# Patient Record
Sex: Female | Born: 1960 | Race: White | Hispanic: No | Marital: Married | Smoking: Former smoker
Health system: Southern US, Community
[De-identification: ages and names within clinical notes are randomized; demographics above are authoritative.]

## PROBLEM LIST (undated history)

## (undated) DIAGNOSIS — Z13220 Encounter for screening for lipoid disorders: Secondary | ICD-10-CM

---
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
## 2013-08-19 DIAGNOSIS — E785 Hyperlipidemia, unspecified: Secondary | ICD-10-CM | POA: Insufficient documentation

## 2013-08-19 DIAGNOSIS — I10 Essential (primary) hypertension: Secondary | ICD-10-CM | POA: Insufficient documentation

## 2013-08-19 DIAGNOSIS — E031 Congenital hypothyroidism without goiter: Secondary | ICD-10-CM | POA: Insufficient documentation

## 2014-01-25 DIAGNOSIS — L309 Dermatitis, unspecified: Secondary | ICD-10-CM | POA: Insufficient documentation

## 2014-06-10 DIAGNOSIS — J3089 Other allergic rhinitis: Secondary | ICD-10-CM | POA: Insufficient documentation

## 2015-07-20 DIAGNOSIS — R030 Elevated blood-pressure reading, without diagnosis of hypertension: Secondary | ICD-10-CM | POA: Diagnosis not present

## 2015-07-20 DIAGNOSIS — H6501 Acute serous otitis media, right ear: Secondary | ICD-10-CM | POA: Diagnosis not present

## 2015-07-20 DIAGNOSIS — Z7689 Persons encountering health services in other specified circumstances: Secondary | ICD-10-CM | POA: Diagnosis not present

## 2015-07-20 DIAGNOSIS — J069 Acute upper respiratory infection, unspecified: Secondary | ICD-10-CM | POA: Diagnosis not present

## 2015-09-26 DIAGNOSIS — Z1231 Encounter for screening mammogram for malignant neoplasm of breast: Secondary | ICD-10-CM | POA: Diagnosis not present

## 2015-09-26 DIAGNOSIS — Z1159 Encounter for screening for other viral diseases: Secondary | ICD-10-CM | POA: Diagnosis not present

## 2015-09-26 DIAGNOSIS — F419 Anxiety disorder, unspecified: Secondary | ICD-10-CM | POA: Diagnosis not present

## 2015-09-26 DIAGNOSIS — I10 Essential (primary) hypertension: Secondary | ICD-10-CM | POA: Diagnosis not present

## 2015-11-04 DIAGNOSIS — F3341 Major depressive disorder, recurrent, in partial remission: Secondary | ICD-10-CM | POA: Diagnosis not present

## 2016-04-30 DIAGNOSIS — F3341 Major depressive disorder, recurrent, in partial remission: Secondary | ICD-10-CM | POA: Diagnosis not present

## 2016-07-23 DIAGNOSIS — F3341 Major depressive disorder, recurrent, in partial remission: Secondary | ICD-10-CM | POA: Diagnosis not present

## 2016-08-06 DIAGNOSIS — L301 Dyshidrosis [pompholyx]: Secondary | ICD-10-CM | POA: Diagnosis not present

## 2016-08-06 DIAGNOSIS — I10 Essential (primary) hypertension: Secondary | ICD-10-CM | POA: Diagnosis not present

## 2016-09-03 DIAGNOSIS — F3341 Major depressive disorder, recurrent, in partial remission: Secondary | ICD-10-CM | POA: Diagnosis not present

## 2016-10-22 DIAGNOSIS — L309 Dermatitis, unspecified: Secondary | ICD-10-CM | POA: Diagnosis not present

## 2016-10-22 DIAGNOSIS — E031 Congenital hypothyroidism without goiter: Secondary | ICD-10-CM | POA: Diagnosis not present

## 2016-10-22 DIAGNOSIS — I10 Essential (primary) hypertension: Secondary | ICD-10-CM | POA: Diagnosis not present

## 2016-10-22 DIAGNOSIS — F339 Major depressive disorder, recurrent, unspecified: Secondary | ICD-10-CM | POA: Diagnosis not present

## 2016-11-01 DIAGNOSIS — F3341 Major depressive disorder, recurrent, in partial remission: Secondary | ICD-10-CM | POA: Diagnosis not present

## 2017-02-15 DIAGNOSIS — H2513 Age-related nuclear cataract, bilateral: Secondary | ICD-10-CM | POA: Diagnosis not present

## 2017-02-15 DIAGNOSIS — H524 Presbyopia: Secondary | ICD-10-CM | POA: Diagnosis not present

## 2017-02-15 DIAGNOSIS — H25043 Posterior subcapsular polar age-related cataract, bilateral: Secondary | ICD-10-CM | POA: Diagnosis not present

## 2017-02-15 DIAGNOSIS — D3131 Benign neoplasm of right choroid: Secondary | ICD-10-CM | POA: Diagnosis not present

## 2017-02-28 ENCOUNTER — Telehealth: Payer: Self-pay

## 2017-02-28 NOTE — Telephone Encounter (Signed)
Call was placed to patient to see who was managing her depression medications. [facility] and is aware that she will need to continue to go there to get her medications. Patient verbalizes understanding and states she loves them and will continue to see them for refills. Pcp is aware of this as well

## 2017-04-18 ENCOUNTER — Ambulatory Visit (INDEPENDENT_AMBULATORY_CARE_PROVIDER_SITE_OTHER): Payer: BLUE CROSS/BLUE SHIELD | Admitting: Physician Assistant

## 2017-04-18 ENCOUNTER — Encounter: Payer: Self-pay | Admitting: Physician Assistant

## 2017-04-18 VITALS — BP 130/74 | HR 76 | Temp 98.1°F | Resp 14 | Ht 65.5 in | Wt 190.4 lb

## 2017-04-18 DIAGNOSIS — I10 Essential (primary) hypertension: Secondary | ICD-10-CM

## 2017-04-18 DIAGNOSIS — Z0000 Encounter for general adult medical examination without abnormal findings: Secondary | ICD-10-CM | POA: Diagnosis not present

## 2017-04-18 LAB — BASIC METABOLIC PANEL WITH GFR
BUN: 15 mg/dL (ref 7–25)
CO2: 29 mmol/L (ref 20–32)
CREATININE: 0.75 mg/dL (ref 0.50–1.05)
Calcium: 9.8 mg/dL (ref 8.6–10.4)
Chloride: 100 mmol/L (ref 98–110)
GFR, Est African American: 101 mL/min/{1.73_m2} (ref >=60)
GFR, Est Non African American: 87 mL/min/{1.73_m2} (ref >=60)
Glucose, Bld: 95 mg/dL (ref 65–99)
Potassium: 4.1 mmol/L (ref 3.5–5.3)
Sodium: 138 mmol/L (ref 135–146)

## 2017-04-18 MED ORDER — LISINOPRIL-HYDROCHLOROTHIAZIDE 20-25 MG PO TABS: 1.0000 | ORAL_TABLET | Freq: Every day | ORAL | 1 refills | Status: DC

## 2017-04-18 NOTE — Progress Notes (Signed)
Patient ID: Elizabeth Cobb MRN: [id number], DOB: 12-29-1960, 57 y.o. Date of Encounter: @DATE @  Chief Complaint:  Chief Complaint  Patient presents with  . New Patient (Initial Visit)    HPI: 56 y.o. year old female  presents as a new patient to establish care.  She reports that she went to the [facility] for about 8 years.   However when that closed she then went to [facility] for 2 visits.   States that that is part of [facility] and she later found out that was not in her network for insurance.   Has some coworkers who come to our office who had recommended our office-- and-- also this location is close for her. States that her last visit at [facility] must have been about 11 months ago because they gave her a year worth of refills and she has about 1 month remaining on that.   Has continued to take her blood pressure medication daily and her medications have not run out.  She thinks that they did check her cholesterol at that last visit --(so they may have coded that as a CPE.)  Also goes to [facility].   States that she sees Melony Overly there.  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  Says that she can write prescriptions.   Asked if they had given her a diagnosis.  Says that she had been felt to have depression but now recently they have been wondering whether she may have been mis-diagnosed and may really be bipolar.   Says that she started going there in 2004-06-30.  States that "in 06/30/2004, after her father died, she could not quit crying ".   They prescribe her Prozac.  They recently added the Abilify and they are adjusting that.  They also prescribe the diazepam.  Says that she uses that as needed because sometimes stress and anxiety cause muscle tension especially in her neck and shoulder region.  In the past has used muscle relaxer and other medication for that muscle tension but they did not work well for that.  States that the Valium does  work better for those symptoms.  Reports that she has gone to [facility] 3 times in the last 2 months because of issues with her eyes and vision.  Reports that these are all of the specialists that she has seen and that this is all of the medical information that she is aware of.  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  Is able to do a lot of walking outside at the park which she says is very good for her.  States that she does not do well having to deal with people and that she likes to be alone.  She has no other concerns to address today. She has no other known past medical history to make me aware of.     History reviewed. No pertinent past medical history.   Home Meds: Outpatient Medications Prior to Visit  Medication Sig Dispense Refill  . b complex vitamins tablet Take 1 tablet by mouth daily.    . Cholecalciferol (VITAMIN D3) 2000 units TABS Take by mouth.    Marland Kitchen DIAZEPAM PO Take 10 mg by mouth as needed.     Marland Kitchen FLUoxetine (PROZAC) 20 MG tablet Take 20 mg by mouth daily. Patient taking 60 mg daily    . mometasone (ELOCON) 0.1 % cream Apply 1 application topically daily. Use  on hands and behind ears    . Omega-3 Fatty Acids (OMEGA 3 PO) Take by mouth.    . pyridOXINE (VITAMIN B-6) 100 MG tablet Take 100 mg by mouth daily.    Marland Kitchen lisinopril-hydrochlorothiazide (PRINZIDE,ZESTORETIC) 20-25 MG tablet Take 1 tablet by mouth daily.    Marland Kitchen pyridOXINE (VITAMIN B-6) 100 MG tablet Take 100 mg by mouth daily.    . ARIPiprazole (ABILIFY) 2 MG tablet Take 2 mg by mouth daily.     No facility-administered medications prior to visit.     Allergies: No Known Allergies  Social History   Socioeconomic History  . Marital status: Married    Spouse name: Not on file  . Number of children: Not on file  . Years of education: Not on file  . Highest education level: Not on file  Social Needs  . Financial resource strain: Not on file  . Food insecurity - worry:  Not on file  . Food insecurity - inability: Not on file  . Transportation needs - medical: Not on file  . Transportation needs - non-medical: Not on file  Occupational History  . Not on file  Tobacco Use  . Smoking status: Current Every Day Smoker    Types: E-cigarettes  . Smokeless tobacco: Never Used  Substance and Sexual Activity  . Alcohol use: Not on file  . Drug use: Not on file  . Sexual activity: Not on file  Other Topics Concern  . Not on file  Social History Narrative  . Not on file    History reviewed. No pertinent family history.   Review of Systems:  See HPI for pertinent ROS. All other ROS negative.    Physical Exam: Blood pressure 130/74, pulse 76, temperature 98.1 F (36.7 C), temperature source Oral, resp. rate 14, height 5' 5.5" (1.664 m), weight 86.4 kg (190 lb 6.4 oz), SpO2 96 %., Body mass index is 31.2 kg/m. General:  WNWD WF. Appears in no acute distress. Neck: Supple. No thyromegaly. No lymphadenopathy. Lungs: Clear bilaterally to auscultation without wheezes, rales, or rhonchi. Breathing is unlabored. Heart: RRR with S1 S2. No murmurs, rubs, or gallops. Abdomen: Soft, non-tender, non-distended with normoactive bowel sounds. No hepatomegaly. No rebound/guarding. No obvious abdominal masses. Musculoskeletal:  Strength and tone normal for age. Extremities/Skin: Warm and dry.  No edema.  Neuro: Alert and oriented X 3. Moves all extremities spontaneously. Gait is normal. CNII-XII grossly in tact. Psych:  Responds to questions appropriately with a normal affect.     ASSESSMENT AND PLAN:  57 y.o. year old female with   Encounter for medical examination to establish care  Essential hypertension Blood Pressure is at goal/controlled.  Continue current medication.  Check lab to monitor. - BASIC METABOLIC PANEL WITH GFR - lisinopril-hydrochlorothiazide (PRINZIDE,ZESTORETIC) 20-25 MG tablet; Take 1 tablet by mouth daily.  Dispense: 90 tablet; Refill:  1  Depression vs Bipolar Disorder --Managed by [facility] --They are in charge of prescribing Prozac, Abilify, Valium  I discussed scheduling a complete physical exam. It is possible that [facility] coded their last visit as a CPE and that visit was about 11 months ago. To be on the safe side,  will have her schedule next visit here in about 3 months as a CPE. She states she can come early morning and can come fasting. Discussed all of this with her and she is agreeable with this plan. Also discussed with schedulers/front office staff.  Follow up sooner if needed.  Signed,  [address] RoadMary Hartlyn StroudDixon, GeorgiaPA, [facility] 04/18/2017 11:04 AM

## 2017-05-13 DIAGNOSIS — F3341 Major depressive disorder, recurrent, in partial remission: Secondary | ICD-10-CM | POA: Diagnosis not present

## 2017-07-11 ENCOUNTER — Ambulatory Visit: Payer: BLUE CROSS/BLUE SHIELD | Admitting: Family Medicine

## 2017-07-11 ENCOUNTER — Encounter: Payer: Self-pay | Admitting: Family Medicine

## 2017-07-11 VITALS — BP 132/88 | HR 70 | Temp 98.1°F | Resp 14 | Ht 65.5 in | Wt 196.0 lb

## 2017-07-11 DIAGNOSIS — N3090 Cystitis, unspecified without hematuria: Secondary | ICD-10-CM | POA: Diagnosis not present

## 2017-07-11 DIAGNOSIS — R300 Dysuria: Secondary | ICD-10-CM | POA: Diagnosis not present

## 2017-07-11 LAB — URINALYSIS, ROUTINE W REFLEX MICROSCOPIC
BILIRUBIN URINE: NEGATIVE
GLUCOSE, UA: NEGATIVE
KETONES UR: NEGATIVE
NITRITE: NEGATIVE
PH: 6 (ref 5.0–8.0)
Protein, ur: NEGATIVE
Specific Gravity, Urine: 1.01 (ref 1.001–1.03)

## 2017-07-11 LAB — MICROSCOPIC MESSAGE

## 2017-07-11 MED ORDER — PHENAZOPYRIDINE HCL 200 MG PO TABS: 200.0000 mg | ORAL_TABLET | Freq: Three times a day (TID) | ORAL | 0 refills | Status: DC | PRN

## 2017-07-11 MED ORDER — CEPHALEXIN 500 MG PO CAPS: 500.0000 mg | ORAL_CAPSULE | Freq: Four times a day (QID) | ORAL | 0 refills | Status: AC

## 2017-07-11 NOTE — Progress Notes (Signed)
Patient ID: Elizabeth Cobb Keithley, female    DOB: 05/23/1960, 57 y.o.   MRN: [id number]  PCP: Dorena Bodoixon, Mary B, PA-C  Chief Complaint  Patient presents with  . Dysuria    symptoms for 8 days     Subjective:   Elizabeth Cobb Rhyne is a 57 y.o. female, presents to clinic with CC of 8 days of dyruria burning more painful at the end of urine stream, ongoing for 10 days.  Azo did nothing to help pain.  Pt has associated urinary frequency, hematuria, and decreased urine output.  She has bladder pressure, but she denies abdominal pain, flank pain, N, V, fever, chills, sweats, change in appetite, change in bowels When her symptoms started she had very mild external genital irritation and itching, she tried Monistat 7 day - last dose 2 days ago - a little soothing to external genitalia but did not change her urinary symptoms at all.  Denies any current vaginal discharge.    Patient Active Problem List   Diagnosis Date Noted  . Essential hypertension 04/18/2017     Prior to Admission medications   Medication Sig Start Date End Date Taking? Authorizing Provider  b complex vitamins tablet Take 1 tablet by mouth daily.   Yes [provider]  Cholecalciferol (VITAMIN D3) 2000 units TABS Take by mouth.   Yes [provider]  DIAZEPAM PO Take 10 mg by mouth as needed.    Yes [provider]  FLUoxetine (PROZAC) 20 MG tablet Take 20 mg by mouth daily. Patient taking 40 mg daily   Yes [provider]  lisinopril-hydrochlorothiazide (PRINZIDE,ZESTORETIC) 20-25 MG tablet Take 1 tablet by mouth daily. 04/18/17  Yes Dixon, Mary B, PA-C  mometasone (ELOCON) 0.1 % cream Apply 1 application topically daily. Use on hands and behind ears   Yes [provider]  Omega-3 Fatty Acids (OMEGA 3 PO) Take by mouth.   Yes [provider]  pyridOXINE (VITAMIN B-6) 100 MG tablet Take 100 mg by mouth daily.   Yes [provider]     No Known Allergies   No family history  on file.   Social History   Socioeconomic History  . Marital status: Married    Spouse name: Not on file  . Number of children: Not on file  . Years of education: Not on file  . Highest education level: Not on file  Occupational History  . Not on file  Social Needs  . Financial resource strain: Not on file  . Food insecurity:    Worry: Not on file    Inability: Not on file  . Transportation needs:    Medical: Not on file    Non-medical: Not on file  Tobacco Use  . Smoking status: Current Every Day Smoker    Types: E-cigarettes  . Smokeless tobacco: Never Used  Substance and Sexual Activity  . Alcohol use: Not on file  . Drug use: Not on file  . Sexual activity: Not on file  Lifestyle  . Physical activity:    Days per week: Not on file    Minutes per session: Not on file  . Stress: Not on file  Relationships  . Social connections:    Talks on phone: Not on file    Gets together: Not on file    Attends religious service: Not on file    Active member of club or organization: Not on file    Attends meetings of clubs or organizations: Not on file  Relationship status: Not on file  . Intimate partner violence:    Fear of current or ex partner: Not on file    Emotionally abused: Not on file    Physically abused: Not on file    Forced sexual activity: Not on file  Other Topics Concern  . Not on file  Social History Narrative  . Not on file     Review of Systems  Constitutional: Negative.  Negative for activity change, appetite change, chills, diaphoresis, fatigue, fever and unexpected weight change.  HENT: Negative.   Eyes: Negative.   Respiratory: Negative.   Cardiovascular: Negative.   Gastrointestinal: Negative.  Negative for abdominal pain, diarrhea, nausea and vomiting.  Endocrine: Negative.   Genitourinary: Positive for decreased urine volume, dysuria, frequency, hematuria, pelvic pain and urgency. Negative for difficulty urinating, dyspareunia, enuresis,  flank pain, genital sores, menstrual problem, vaginal bleeding, vaginal discharge and vaginal pain.  Musculoskeletal: Negative.  Negative for back pain.  Skin: Negative.   Allergic/Immunologic: Negative.   Neurological: Negative for dizziness, weakness and light-headedness.  All other systems reviewed and are negative.      Objective:    Vitals:   07/11/17 0808  BP: 132/88  Pulse: 70  Resp: 14  Temp: 98.1 F (36.7 C)  TempSrc: Oral  SpO2: 97%  Weight: 196 lb (88.9 kg)  Height: 5' 5.5" (1.664 m)      Physical Exam  Constitutional: She appears well-developed and well-nourished. No distress.  HENT:  Head: Normocephalic and atraumatic.  Nose: Nose normal.  Mouth/Throat: Oropharynx is clear and moist.  Eyes: Pupils are equal, round, and reactive to light. Conjunctivae are normal. Right eye exhibits no discharge. Left eye exhibits no discharge.  Neck: Normal range of motion. No tracheal deviation present.  Cardiovascular: Normal rate, regular rhythm, normal heart sounds and intact distal pulses. Exam reveals no gallop and no friction rub.  No murmur heard. Pulmonary/Chest: Effort normal and breath sounds normal. No stridor. No respiratory distress. She has no wheezes. She has no rales. She exhibits no tenderness.  Abdominal: Soft. Bowel sounds are normal. She exhibits no distension and no mass. There is no tenderness. There is no rebound and no guarding. No hernia.  No CVA tenderness b/l  Musculoskeletal: Normal range of motion.  Neurological: She is alert. She exhibits normal muscle tone. Coordination normal.  Skin: Skin is warm and dry. Capillary refill takes less than 2 seconds. No rash noted. She is not diaphoretic. No erythema. No pallor.  Psychiatric: She has a normal mood and affect. Her behavior is normal.  Nursing note and vitals reviewed.         Assessment & Plan:      ICD-10-CM   1. Dysuria R30.0 Urinalysis, Routine w reflex microscopic    UA pertinent for  +leuks and + blood, will culture, and start tx with keflex and pyridium.   Pt did not want to do any vaginal testing even though she endorsed some external genital irritation that got better with use of monistat.  I offered diflucan as an option to tx any worsening vaginitis sx.  She will do probiotics and yogurt with yeast cultures first and only fill diflucan as needed.   If sx do not resolve we have discussed other possible causes such as atrophic vaginitis, which would need further exam/evaluation, pt verbalized understanding of plan.   Danelle Berry, PA-C 07/11/17 8:22 AM

## 2017-07-11 NOTE — Patient Instructions (Addendum)
Will do keflex for 3-5 days, follow up as needed

## 2017-07-13 LAB — URINE CULTURE
MICRO NUMBER:: 90681600
SPECIMEN QUALITY: ADEQUATE

## 2017-07-14 NOTE — Progress Notes (Signed)
please notify pt of Urine culture + with bacteria E. Cool, it's very common.  She should finish antibiotics, drink plenty of fluids.  Follow up if not feeling better.

## 2017-07-22 ENCOUNTER — Encounter: Payer: BLUE CROSS/BLUE SHIELD | Admitting: Physician Assistant

## 2017-10-25 DIAGNOSIS — F3341 Major depressive disorder, recurrent, in partial remission: Secondary | ICD-10-CM | POA: Diagnosis not present

## 2017-10-28 ENCOUNTER — Ambulatory Visit (INDEPENDENT_AMBULATORY_CARE_PROVIDER_SITE_OTHER): Payer: BLUE CROSS/BLUE SHIELD | Admitting: Physician Assistant

## 2017-10-28 ENCOUNTER — Other Ambulatory Visit: Payer: Self-pay

## 2017-10-28 ENCOUNTER — Encounter: Payer: Self-pay | Admitting: Physician Assistant

## 2017-10-28 VITALS — BP 136/90 | HR 58 | Temp 97.6°F | Resp 18 | Ht 66.0 in | Wt 190.0 lb

## 2017-10-28 DIAGNOSIS — E0590 Thyrotoxicosis, unspecified without thyrotoxic crisis or storm: Secondary | ICD-10-CM | POA: Diagnosis not present

## 2017-10-28 DIAGNOSIS — I10 Essential (primary) hypertension: Secondary | ICD-10-CM

## 2017-10-28 DIAGNOSIS — Z23 Encounter for immunization: Secondary | ICD-10-CM | POA: Diagnosis not present

## 2017-10-28 DIAGNOSIS — Z0000 Encounter for general adult medical examination without abnormal findings: Secondary | ICD-10-CM

## 2017-10-28 MED ORDER — LISINOPRIL-HYDROCHLOROTHIAZIDE 20-25 MG PO TABS: 1.0000 | ORAL_TABLET | Freq: Every day | ORAL | 1 refills | Status: DC

## 2017-10-28 NOTE — Progress Notes (Signed)
Patient ID: Notnamed Croucher MRN: [id number], DOB: 11-20-1960, 57 y.o. Date of Encounter: 10/28/2017,   Chief Complaint: Physical (CPE)  HPI: 57 y.o. y/o female  here for CPE.    Today I have reviewed her office note from 04/18/2017. At that visit she presented as a new patient to establish care. That note includes information regarding her past medical history.  See that note for detail.  Today she presents for complete physical exam. However she states that she does not want to do a Pap smear or a breast exam or a pelvic exam.  Says that she "is not comfortable having people touch her"---and adds comment "well , we will not get into that today."    Review of Systems: Consitutional: No fever, chills, fatigue, night sweats, lymphadenopathy. No significant/unexplained weight changes. Eyes: No visual changes, eye redness, or discharge. ENT/Mouth: No ear pain, sore throat, nasal drainage, or sinus pain. Cardiovascular: No chest pressure,heaviness, tightness or squeezing, even with exertion. No increased shortness of breath or dyspnea on exertion.No palpitations, edema, orthopnea, PND. Respiratory: No cough, hemoptysis, SOB, or wheezing. Gastrointestinal: No anorexia, dysphagia, reflux, pain, nausea, vomiting, hematemesis, diarrhea, constipation, BRBPR, or melena. Breast: No mass, nodules, bulging, or retraction. No skin changes or inflammation. No nipple discharge. No lymphadenopathy. Genitourinary: No dysuria, hematuria, incontinence, vaginal discharge, pruritis, burning, abnormal bleeding, or pain. Musculoskeletal: No decreased ROM, No joint pain or swelling. No significant pain in neck, back, or extremities. Skin: No rash, pruritis, or concerning lesions. Neurological: No headache, dizziness, syncope, seizures, tremors, memory loss, coordination problems, or paresthesias. Psychological: No hallucinations, SI/HI. Endocrine: No polydipsia, polyphagia, polyuria, or known diabetes.No  increased fatigue. No palpitations/rapid heart rate. No significant/unexplained weight change. All other systems were reviewed and are otherwise negative.  History reviewed. No pertinent past medical history.   History reviewed. No pertinent surgical history.  Home Meds:  Outpatient Medications Prior to Visit  Medication Sig Dispense Refill  . b complex vitamins tablet Take 1 tablet by mouth daily.    . Cholecalciferol (VITAMIN D3) 2000 units TABS Take by mouth.    Marland Kitchen DIAZEPAM PO Take 10 mg by mouth as needed.     Marland Kitchen FLUoxetine (PROZAC) 20 MG tablet Take 20 mg by mouth daily. Patient taking 40 mg daily    . lisinopril-hydrochlorothiazide (PRINZIDE,ZESTORETIC) 20-25 MG tablet Take 1 tablet by mouth daily. 90 tablet 1  . mometasone (ELOCON) 0.1 % cream Apply 1 application topically daily. Use on hands and behind ears    . Omega-3 Fatty Acids (OMEGA 3 PO) Take by mouth.    . pyridOXINE (VITAMIN B-6) 100 MG tablet Take 100 mg by mouth daily.    . phenazopyridine (PYRIDIUM) 200 MG tablet Take 1 tablet (200 mg total) by mouth 3 (three) times daily as needed for pain. 10 tablet 0   No facility-administered medications prior to visit.     Allergies: No Known Allergies  Social History   Socioeconomic History  . Marital status: Married    Spouse name: Not on file  . Number of children: Not on file  . Years of education: Not on file  . Highest education level: Not on file  Occupational History  . Not on file  Social Needs  . Financial resource strain: Not on file  . Food insecurity:    Worry: Not on file    Inability: Not on file  . Transportation needs:    Medical: Not on file    Non-medical: Not on file  Tobacco Use  . Smoking status: Current Every Day Smoker    Types: E-cigarettes  . Smokeless tobacco: Never Used  Substance and Sexual Activity  . Alcohol use: Not on file  . Drug use: Not on file  . Sexual activity: Not on file  Lifestyle  . Physical activity:    Days per  week: Not on file    Minutes per session: Not on file  . Stress: Not on file  Relationships  . Social connections:    Talks on phone: Not on file    Gets together: Not on file    Attends religious service: Not on file    Active member of club or organization: Not on file    Attends meetings of clubs or organizations: Not on file    Relationship status: Not on file  . Intimate partner violence:    Fear of current or ex partner: Not on file    Emotionally abused: Not on file    Physically abused: Not on file    Forced sexual activity: Not on file  Other Topics Concern  . Not on file  Social History Narrative  . Not on file    Family History  Problem Relation Age of Onset  . Cancer Mother        Some type of Leukemia  . Lung cancer Father   . Bipolar disorder Sister     Physical Exam: Blood pressure 136/90, pulse (!) 58, temperature 97.6 F (36.4 C), temperature source Oral, resp. rate 18, height 5\' 6"  (1.676 m), weight 86.2 kg, SpO2 97 %., Body mass index is 30.67 kg/m. General: WF. Appears in no acute distress. HEENT: Normocephalic, atraumatic. Conjunctiva pink, sclera non-icteric. Pupils 2 mm constricting to 1 mm, round, regular, and equally reactive to light and accomodation. EOMI. Internal auditory canal clear. TMs with good cone of light and without pathology. Nasal mucosa pink. Nares are without discharge. No sinus tenderness. Oral mucosa pink. Neck: Supple. Trachea midline. No thyromegaly. Full ROM. No lymphadenopathy.No Carotid Bruits. Lungs: Clear to auscultation bilaterally without wheezes, rales, or rhonchi. Breathing is of normal effort and unlabored. Cardiovascular: RRR with S1 S2. No murmurs, rubs, or gallops. Distal pulses 2+ symmetrically. No carotid or abdominal bruits. Breast: She refuses/ defers breast exam. Abdomen: Soft, non-tender, non-distended with normoactive bowel sounds. No hepatosplenomegaly or masses. No rebound/guarding. No CVA tenderness. No  hernias.  Genitourinary:  She refuses / defers pelvic exam. Musculoskeletal: Full range of motion and 5/5 strength throughout.  Skin: Warm and moist without erythema, ecchymosis, wounds, or rash. Neuro: A+Ox3. CN II-XII grossly intact. Moves all extremities spontaneously. Full sensation throughout. Normal gait.  Psych:  Responds to questions appropriately with a normal affect.   Assessment/Plan:  57 y.o. y/o female here for CPE   1. Encounter for preventive health examination  A. Screening Labs: She is fasting today.  She is agreeable to check screening labs. - CBC with Differential/Platelet - COMPLETE METABOLIC PANEL WITH GFR - Lipid panel - TSH  B. Pap: She refuses pelvic exam, Pap smear today.  Aware of risk versus benefit but still defers.  C. Screening Mammogram: She is agreeable to have a mammogram.  Prefers to go to [facility]. - MM DIGITAL SCREENING BILATERAL; Future  D. DEXA/BMD:  She does not require bone density scan until closer to age 58.  E. Colorectal Cancer Screening: She refuses colonoscopy but states that she will do the Cologuard if it is covered by her insurance.  She is  to contact her insurance and find out with her specific plan whether they cover the Cologuard and what her cost would be and then if she is agreeable with that cost then will proceed with Cologuard.  F. Immunizations:  Influenza: Recommended flu vaccine but she defers. Tetanus: She states last tetanus was > 10 hours ago.  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 Pneumococcal: She has no indication for pneumonia vaccine until age 57 Shingrix: Discussed shingles and the shingles vaccine today but she defers.    2. Essential hypertension Blood pressure is at goal.  Continue current medication.  Check lab to monitor. - COMPLETE METABOLIC PANEL WITH GFR   3. Depression vs Bipolar  D/O --- [facility]  See my note from 04/18/2017 for information regarding this.           [address]igned, Mumtaz Lovins Meekah Mockingbird ValleyDixon, GeorgiaPA, [facility] 10/28/2017 10:47 AM

## 2017-10-28 NOTE — Addendum Note (Signed)
Addended by: Phineas SemenJOHNSON, Vernon Maish A on: 10/28/2017 04:31 PM   Modules accepted: Orders

## 2017-10-29 ENCOUNTER — Encounter: Payer: Self-pay | Admitting: Physician Assistant

## 2017-10-29 LAB — LIPID PANEL
CHOLESTEROL: 177 mg/dL (ref <200)
HDL: 42 mg/dL — ABNORMAL LOW (ref >50)
LDL Cholesterol (Calc): 98 mg/dL (calc)
Non-HDL Cholesterol (Calc): 135 mg/dL (calc) — ABNORMAL HIGH (ref <130)
Total CHOL/HDL Ratio: 4.2 (calc) (ref <5.0)
Triglycerides: 242 mg/dL — ABNORMAL HIGH (ref <150)

## 2017-10-29 LAB — COMPLETE METABOLIC PANEL WITH GFR
AG RATIO: 2 (calc) (ref 1.0–2.5)
ALBUMIN MSPROF: 4.5 g/dL (ref 3.6–5.1)
ALKALINE PHOSPHATASE (APISO): 56 U/L (ref 33–130)
ALT: 18 U/L (ref 6–29)
AST: 24 U/L (ref 10–35)
BILIRUBIN TOTAL: 0.3 mg/dL (ref 0.2–1.2)
BUN: 11 mg/dL (ref 7–25)
CHLORIDE: 99 mmol/L (ref 98–110)
CO2: 30 mmol/L (ref 20–32)
Calcium: 9.8 mg/dL (ref 8.6–10.4)
Creat: 0.69 mg/dL (ref 0.50–1.05)
GFR, EST AFRICAN AMERICAN: 110 mL/min/{1.73_m2} (ref >=60)
GFR, Est Non African American: 95 mL/min/{1.73_m2} (ref >=60)
GLOBULIN: 2.2 g/dL (ref 1.9–3.7)
Glucose, Bld: 92 mg/dL (ref 65–99)
Potassium: 4 mmol/L (ref 3.5–5.3)
Sodium: 138 mmol/L (ref 135–146)
TOTAL PROTEIN: 6.7 g/dL (ref 6.1–8.1)

## 2017-10-29 LAB — T4, FREE: Free T4: 0.9 ng/dL (ref 0.8–1.8)

## 2017-10-29 LAB — CBC WITH DIFFERENTIAL/PLATELET
BASOS ABS: 38 {cells}/uL (ref 0–200)
BASOS PCT: 0.8 %
EOS ABS: 139 {cells}/uL (ref 15–500)
Eosinophils Relative: 2.9 %
HEMATOCRIT: 37.7 % (ref 35.0–45.0)
HEMOGLOBIN: 12.8 g/dL (ref 11.7–15.5)
Lymphs Abs: 1498 cells/uL (ref 850–3900)
MCH: 30.5 pg (ref 27.0–33.0)
MCHC: 34 g/dL (ref 32.0–36.0)
MCV: 90 fL (ref 80.0–100.0)
MONOS PCT: 10.9 %
MPV: 13.5 fL — ABNORMAL HIGH (ref 7.5–12.5)
NEUTROS ABS: 2602 {cells}/uL (ref 1500–7800)
Neutrophils Relative %: 54.2 %
Platelets: 131 10*3/uL — ABNORMAL LOW (ref 140–400)
RBC: 4.19 10*6/uL (ref 3.80–5.10)
RDW: 11.8 % (ref 11.0–15.0)
Total Lymphocyte: 31.2 %
WBC: 4.8 10*3/uL (ref 3.8–10.8)
WBCMIX: 523 {cells}/uL (ref 200–950)

## 2017-10-29 LAB — TEST AUTHORIZATION

## 2017-10-29 LAB — TSH: TSH: 6.99 m[IU]/L — AB (ref 0.40–4.50)

## 2017-11-05 DIAGNOSIS — E038 Other specified hypothyroidism: Secondary | ICD-10-CM | POA: Insufficient documentation

## 2017-11-05 DIAGNOSIS — E039 Hypothyroidism, unspecified: Secondary | ICD-10-CM | POA: Insufficient documentation

## 2017-11-21 ENCOUNTER — Other Ambulatory Visit: Payer: Self-pay | Admitting: Physician Assistant

## 2017-11-21 ENCOUNTER — Ambulatory Visit (HOSPITAL_COMMUNITY)
Admission: RE | Admit: 2017-11-21 | Discharge: 2017-11-21 | Disposition: A | Discharge status: Home / Self Care | Payer: BLUE CROSS/BLUE SHIELD | Source: Ambulatory Visit | Attending: Physician Assistant | Admitting: Physician Assistant

## 2017-11-21 DIAGNOSIS — Z0000 Encounter for general adult medical examination without abnormal findings: Secondary | ICD-10-CM | POA: Diagnosis not present

## 2017-11-21 DIAGNOSIS — Z1231 Encounter for screening mammogram for malignant neoplasm of breast: Secondary | ICD-10-CM | POA: Diagnosis not present

## 2018-02-21 ENCOUNTER — Encounter: Payer: Self-pay | Admitting: Emergency Medicine

## 2018-02-21 DIAGNOSIS — F411 Generalized anxiety disorder: Secondary | ICD-10-CM | POA: Insufficient documentation

## 2018-02-21 DIAGNOSIS — F329 Major depressive disorder, single episode, unspecified: Secondary | ICD-10-CM | POA: Insufficient documentation

## 2018-04-11 ENCOUNTER — Ambulatory Visit (INDEPENDENT_AMBULATORY_CARE_PROVIDER_SITE_OTHER): Payer: BLUE CROSS/BLUE SHIELD | Admitting: Physician Assistant

## 2018-04-11 ENCOUNTER — Encounter: Payer: Self-pay | Admitting: Physician Assistant

## 2018-04-11 DIAGNOSIS — F411 Generalized anxiety disorder: Secondary | ICD-10-CM

## 2018-04-11 DIAGNOSIS — F331 Major depressive disorder, recurrent, moderate: Secondary | ICD-10-CM

## 2018-04-11 MED ORDER — FLUOXETINE HCL 20 MG PO TABS: 40.0000 mg | ORAL_TABLET | Freq: Every day | ORAL | 1 refills | Status: DC

## 2018-04-11 MED ORDER — DIAZEPAM 10 MG PO TABS: 10.0000 mg | ORAL_TABLET | Freq: Three times a day (TID) | ORAL | 5 refills | Status: DC | PRN

## 2018-04-11 NOTE — Progress Notes (Signed)
[facility]  Patient ID: Elizabeth Cobb,  MRN: [id number]  PCP: Danelle Berry, PA-C  Date of Evaluation: 04/11/2018 Time spent:15 minutes  Chief Complaint:  Chief Complaint    Follow-up      HISTORY/CURRENT STATUS: HPI Here for routine med check.  Doing really well.  She's had 2 panic attacks since LOV.  Took the Valium which helped.   Patient denies loss of interest in usual activities and is able to enjoy things.  Denies decreased energy or motivation.  Appetite has not changed.  No extreme sadness, tearfulness, or feelings of hopelessness.  Denies any changes in concentration, making decisions or remembering things.  Denies suicidal or homicidal thoughts.  Patient denies increased energy with decreased need for sleep, no increased talkativeness, no racing thoughts, no impulsivity or risky behaviors, no increased spending, no increased libido, no grandiosity.  Denies muscle or joint pain, stiffness, or dystonia.  Denies dizziness, syncope, seizures, numbness, tingling, tremor, tics, unsteady gait, slurred speech, confusion.   Individual Medical History/ Review of Systems: Changes? :No    Past medications for mental health diagnoses include: Chantix, Prozac, Xanax, Remeron, Klonopin, Wellbutrin caused increased agitation.   Allergies: Patient has no known allergies.  Current Medications:  Current Outpatient Medications:  .  b complex vitamins tablet, Take 1 tablet by mouth daily., Disp: , Rfl:  .  Cholecalciferol (VITAMIN D3) 2000 units TABS, Take by mouth., Disp: , Rfl:  .  FLUoxetine (PROZAC) 20 MG tablet, Take 2 tablets (40 mg total) by mouth daily., Disp: 180 tablet, Rfl: 1 .  lisinopril-hydrochlorothiazide (PRINZIDE,ZESTORETIC) 20-25 MG tablet, Take 1 tablet by mouth daily., Disp: 90 tablet, Rfl: 1 .  Omega-3 Fatty Acids (OMEGA 3 PO), Take by mouth., Disp: , Rfl:  .  pyridOXINE (VITAMIN B-6) 100 MG tablet, Take 100 mg by mouth daily., Disp: , Rfl:  .   diazepam (VALIUM) 10 MG tablet, Take 1 tablet (10 mg total) by mouth every 8 (eight) hours as needed., Disp: 90 tablet, Rfl: 5 .  mometasone (ELOCON) 0.1 % cream, Apply 1 application topically daily. Use on hands and behind ears, Disp: , Rfl:  Medication Side Effects: none  Family Medical/ Social History: Changes? No  MENTAL HEALTH EXAM:  There were no vitals taken for this visit.There is no height or weight on file to calculate BMI.  General Appearance: Casual and Well Groomed  Eye Contact:  Good  Speech:  Clear and Coherent  Volume:  Normal  Mood:  Euthymic  Affect:  Appropriate  Thought Process:  Goal Directed  Orientation:  Full (Time, Place, and Person)  Thought Content: Logical   Suicidal Thoughts:  No  Homicidal Thoughts:  No  Memory:  WNL  Judgement:  Good  Insight:  Good  Psychomotor Activity:  Normal  Concentration:  Concentration: Good  Recall:  Good  Fund of Knowledge: Good  Language: Good  Assets:  Desire for Improvement  ADL's:  Intact  Cognition: WNL  Prognosis:  Good    DIAGNOSES:    ICD-10-CM   1. Major depressive disorder, recurrent episode, moderate (HCC) F33.1   2. Generalized anxiety disorder F41.1     Receiving Psychotherapy: No    RECOMMENDATIONS: Continue Prozac 40 mg daily.  She prefers to have the 20 mg pills and take 2 of those because they are easier to swallow. Continue Valium 10 mg every 8 hours as needed. Continue vitamin B complex, omega-3 fatty acids, vitamin D, and a multivitamin every day. Return in 6 months  or sooner as needed.  Melony Overly, PA-C   This record has been created using [employer].  Chart creation errors have been sought, but may not always have been located and corrected. Such creation errors do not reflect on the standard of medical care.

## 2018-05-01 ENCOUNTER — Ambulatory Visit: Payer: BLUE CROSS/BLUE SHIELD | Admitting: Physician Assistant

## 2018-05-01 ENCOUNTER — Encounter: Payer: Self-pay | Admitting: Family Medicine

## 2018-05-01 ENCOUNTER — Ambulatory Visit (INDEPENDENT_AMBULATORY_CARE_PROVIDER_SITE_OTHER): Payer: BLUE CROSS/BLUE SHIELD | Admitting: Family Medicine

## 2018-05-01 ENCOUNTER — Other Ambulatory Visit: Payer: Self-pay

## 2018-05-01 VITALS — BP 142/88 | HR 78 | Temp 98.0°F | Resp 18 | Ht 66.0 in | Wt 194.4 lb

## 2018-05-01 DIAGNOSIS — E038 Other specified hypothyroidism: Secondary | ICD-10-CM

## 2018-05-01 DIAGNOSIS — E669 Obesity, unspecified: Secondary | ICD-10-CM

## 2018-05-01 DIAGNOSIS — Z13220 Encounter for screening for lipoid disorders: Secondary | ICD-10-CM | POA: Diagnosis not present

## 2018-05-01 DIAGNOSIS — E039 Hypothyroidism, unspecified: Secondary | ICD-10-CM

## 2018-05-01 DIAGNOSIS — L309 Dermatitis, unspecified: Secondary | ICD-10-CM

## 2018-05-01 DIAGNOSIS — I10 Essential (primary) hypertension: Secondary | ICD-10-CM

## 2018-05-01 DIAGNOSIS — F411 Generalized anxiety disorder: Secondary | ICD-10-CM

## 2018-05-01 DIAGNOSIS — Z6831 Body mass index (BMI) 31.0-31.9, adult: Secondary | ICD-10-CM

## 2018-05-01 DIAGNOSIS — J310 Chronic rhinitis: Secondary | ICD-10-CM

## 2018-05-01 MED ORDER — FLUTICASONE PROPIONATE 50 MCG/ACT NA SUSP: 2.0000 | Freq: Every day | NASAL | 11 refills | Status: DC

## 2018-05-01 MED ORDER — LISINOPRIL 20 MG PO TABS: 20.0000 mg | ORAL_TABLET | Freq: Every day | ORAL | 3 refills | Status: DC

## 2018-05-01 MED ORDER — MOMETASONE FUROATE 0.1 % EX CREA: 1.0000 "application " | TOPICAL_CREAM | Freq: Every day | CUTANEOUS | 2 refills | Status: DC

## 2018-05-01 MED ORDER — HYDROCHLOROTHIAZIDE 25 MG PO TABS: 25.0000 mg | ORAL_TABLET | Freq: Every day | ORAL | 3 refills | Status: DC

## 2018-05-01 NOTE — Patient Instructions (Addendum)
Get and take daily at bedtime zyrtec, claritin or allegra  You can continue flonase daily  As needed the over the counter decongestant    Additional Hand outs to sent to pt with lab results    Hypothyroidism  Hypothyroidism is when the thyroid gland does not make enough of certain hormones (it is underactive). The thyroid gland is a small gland located in the lower front part of the neck, just in front of the windpipe (trachea). This gland makes hormones that help control how the body uses food for energy (metabolism) as well as how the heart and brain function. These hormones also play a role in keeping your bones strong. When the thyroid is underactive, it produces too little of the hormones thyroxine (T4) and triiodothyronine (T3). What are the causes? This condition may be caused by:  Hashimoto's disease. This is a disease in which the body's disease-fighting system (immune system) attacks the thyroid gland. This is the most common cause.  Viral infections.  Pregnancy.  Certain medicines.  Birth defects.  Past radiation treatments to the head or neck for cancer.  Past treatment with radioactive iodine.  Past exposure to radiation in the environment.  Past surgical removal of part or all of the thyroid.  Problems with a gland in the center of the brain (pituitary gland).  Lack of enough iodine in the diet. What increases the risk? You are more likely to develop this condition if:  You are female.  You have a family history of thyroid conditions.  You use a medicine called lithium.  You take medicines that affect the immune system (immunosuppressants). What are the signs or symptoms? Symptoms of this condition include:  Feeling as though you have no energy (lethargy).  Not being able to tolerate cold.  Weight gain that is not explained by a change in diet or exercise habits.  Lack of appetite.  Dry skin.  Coarse hair.  Menstrual  irregularity.  Slowing of thought processes.  Constipation.  Sadness or depression. How is this diagnosed? This condition may be diagnosed based on:  Your symptoms, your medical history, and a physical exam.  Blood tests. You may also have imaging tests, such as an ultrasound or MRI. How is this treated? This condition is treated with medicine that replaces the thyroid hormones that your body does not make. After you begin treatment, it may take several weeks for symptoms to go away. Follow these instructions at home:  Take over-the-counter and prescription medicines only as told by your health care provider.  If you start taking any new medicines, tell your health care provider.  Keep all follow-up visits as told by your health care provider. This is important. ? As your condition improves, your dosage of thyroid hormone medicine may change. ? You will need to have blood tests regularly so that your health care provider can monitor your condition. Contact a health care provider if:  Your symptoms do not get better with treatment.  You are taking thyroid replacement medicine and you: ? Sweat a lot. ? Have tremors. ? Feel anxious. ? Lose weight rapidly. ? Cannot tolerate heat. ? Have emotional swings. ? Have diarrhea. ? Feel weak. Get help right away if you have:  Chest pain.  An irregular heartbeat.  A rapid heartbeat.  Difficulty breathing. Summary  Hypothyroidism is when the thyroid gland does not make enough of certain hormones (it is underactive).  When the thyroid is underactive, it produces too little of the hormones  thyroxine (T4) and triiodothyronine (T3).  The most common cause is Hashimoto's disease, a disease in which the body's disease-fighting system (immune system) attacks the thyroid gland. The condition can also be caused by viral infections, medicine, pregnancy, or past radiation treatment to the head or neck.  Symptoms may include weight gain,  dry skin, constipation, feeling as though you do not have energy, and not being able to tolerate cold.  This condition is treated with medicine to replace the thyroid hormones that your body does not make. This information is not intended to replace advice given to you by your health care provider. Make sure you discuss any questions you have with your health care provider. Document Released: 01/22/2005 Document Revised: 01/02/2017 Document Reviewed: 01/02/2017 Elsevier Interactive Patient Education  2019 [employer]   High Cholesterol  High cholesterol is a condition in which the blood has high levels of a white, waxy, fat-like substance (cholesterol). The human body needs small amounts of cholesterol. The liver makes all the cholesterol that the body needs. Extra (excess) cholesterol comes from the food that we eat. Cholesterol is carried from the liver by the blood through the blood vessels. If you have high cholesterol, deposits (plaques) may build up on the walls of your blood vessels (arteries). Plaques make the arteries narrower and stiffer. Cholesterol plaques increase your risk for heart attack and stroke. Work with your health care provider to keep your cholesterol levels in a healthy range. What increases the risk? This condition is more likely to develop in people who:  Eat foods that are high in animal fat (saturated fat) or cholesterol.  Are overweight.  Are not getting enough exercise.  Have a family history of high cholesterol. What are the signs or symptoms? There are no symptoms of this condition. How is this diagnosed? This condition may be diagnosed from the results of a blood test.  If you are older than age 75, your health care provider may check your cholesterol every 4-6 years.  You may be checked more often if you already have high cholesterol or other risk factors for heart disease. The blood test for cholesterol measures:  "Bad" cholesterol (LDL  cholesterol). This is the main type of cholesterol that causes heart disease. The desired level for LDL is less than 100.  "Good" cholesterol (HDL cholesterol). This type helps to protect against heart disease by cleaning the arteries and carrying the LDL away. The desired level for HDL is 60 or higher.  Triglycerides. These are fats that the body can store or burn for energy. The desired number for triglycerides is lower than 150.  Total cholesterol. This is a measure of the total amount of cholesterol in your blood, including LDL cholesterol, HDL cholesterol, and triglycerides. A healthy number is less than 200. How is this treated? This condition is treated with diet changes, lifestyle changes, and medicines. Diet changes  This may include eating more whole grains, fruits, vegetables, nuts, and fish.  This may also include cutting back on red meat and foods that have a lot of added sugar. Lifestyle changes  Changes may include getting at least 40 minutes of aerobic exercise 3 times a week. Aerobic exercises include walking, biking, and swimming. Aerobic exercise along with a healthy diet can help you maintain a healthy weight.  Changes may also include quitting smoking. Medicines  Medicines are usually given if diet and lifestyle changes have failed to reduce your cholesterol to healthy levels.  Your health care provider may  prescribe a statin medicine. Statin medicines have been shown to reduce cholesterol, which can reduce the risk of heart disease. Follow these instructions at home: Eating and drinking If told by your health care provider:  Eat chicken (without skin), fish, veal, shellfish, ground [place] breast, and round or loin cuts of red meat.  Do not eat fried foods or fatty meats, such as hot dogs and salami.  Eat plenty of fruits, such as apples.  Eat plenty of vegetables, such as broccoli, potatoes, and carrots.  Eat beans, peas, and lentils.  Eat grains such as  barley, rice, couscous, and bulgur wheat.  Eat pasta without cream sauces.  Use skim or nonfat milk, and eat low-fat or nonfat yogurt and cheeses.  Do not eat or drink whole milk, cream, ice cream, egg yolks, or hard cheeses.  Do not eat stick margarine or tub margarines that contain trans fats (also called partially hydrogenated oils).  Do not eat saturated tropical oils, such as coconut oil and palm oil.  Do not eat cakes, cookies, crackers, or other baked goods that contain trans fats.  General instructions  Exercise as directed by your health care provider. Increase your activity level with activities such as gardening, walking, and taking the stairs.  Take over-the-counter and prescription medicines only as told by your health care provider.  Do not use any products that contain nicotine or tobacco, such as cigarettes and e-cigarettes. If you need help quitting, ask your health care provider.  Keep all follow-up visits as told by your health care provider. This is important. Contact a health care provider if:  You are struggling to maintain a healthy diet or weight.  You need help to start on an exercise program.  You need help to stop smoking. Get help right away if:  You have chest pain.  You have trouble breathing. This information is not intended to replace advice given to you by your health care provider. Make sure you discuss any questions you have with your health care provider. Document Released: 01/22/2005 Document Revised: 08/20/2015 Document Reviewed: 07/23/2015 Elsevier Interactive Patient Education  [employer].

## 2018-05-01 NOTE — Progress Notes (Signed)
Patient ID: Elizabeth Cobb, female    DOB: 1960-06-15, 58 y.o.   MRN: [id number]  PCP: Danelle Berry, PA-C  Chief Complaint  Patient presents with  . Hypertension    Subjective:   Elizabeth Cobb is a 58 y.o. female, presents to clinic with CC of HTN follow up - usually does a  6 month recheck   She reports that she has gained weight, but her BP is good.  She has been taking lisinopril and hydrochlorothiazide in a new combination pill that Shon Hale gave her about 6 months ago, she is compliant with the medications but she does not like the bigger size of the pill.  Not having any blood pressure issues and denies any side effects.  Trouble with swallowing - sounds like, she mentioned that it is hard for her to swallow two different tablets of her routine meds.  She states that it just feels dry in her throat.  She is not having any dysphasia with solids liquids or with her pills she just is likely it fills.  She has no stridor, or sensation of blockage in her neck throat or chest.    Psychiatric -she manages all her psychiatric medicines with her PA Hurst, manages mood, anxiety, talks to the PA is doing well on Valium and Prozac, and she has what she needs (relative to COVID).  She would like to separate her BP meds again to make smaller.      Patient Active Problem List   Diagnosis Date Noted  . MDD (major depressive disorder) 02/21/2018  . GAD (generalized anxiety disorder) 02/21/2018  . Subclinical hypothyroidism 11/05/2017  . Essential hypertension 04/18/2017    Current Meds  Medication Sig  . b complex vitamins tablet Take 1 tablet by mouth daily.  . Cholecalciferol (VITAMIN D3) 2000 units TABS Take by mouth.  . diazepam (VALIUM) 10 MG tablet Take 1 tablet (10 mg total) by mouth every 8 (eight) hours as needed.  Marland Kitchen FLUoxetine (PROZAC) 20 MG tablet Take 2 tablets (40 mg total) by mouth daily.  . fluticasone (FLONASE) 50 MCG/ACT nasal spray Place into both nostrils daily.  Marland Kitchen  lisinopril-hydrochlorothiazide (PRINZIDE,ZESTORETIC) 20-25 MG tablet Take 1 tablet by mouth daily.  . mometasone (ELOCON) 0.1 % cream Apply 1 application topically daily. Use on hands and behind ears  . Omega-3 Fatty Acids (OMEGA 3 PO) Take by mouth.  . pyridOXINE (VITAMIN B-6) 100 MG tablet Take 100 mg by mouth daily.     Review of Systems  Constitutional: Negative.   HENT: Negative.   Eyes: Negative.   Respiratory: Negative.   Cardiovascular: Negative.   Gastrointestinal: Negative.   Endocrine: Negative.   Genitourinary: Negative.   Musculoskeletal: Negative.   Skin: Negative.   Allergic/Immunologic: Negative.   Neurological: Negative.   Hematological: Negative.   Psychiatric/Behavioral: Negative.   All other systems reviewed and are negative.      Objective:    Vitals:   05/01/18 1123  BP: (!) 142/88  Pulse: 78  Resp: 18  Temp: 98 F (36.7 C)  SpO2: 96%  Weight: 194 lb 6.4 oz (88.2 kg)  Height:  (1.676 m)      Physical Exam Vitals signs and nursing note reviewed.  Constitutional:      General: She is not in acute distress.    Appearance: Normal appearance. She is well-developed. She is obese. She is not ill-appearing, toxic-appearing or diaphoretic.  HENT:     Head: Normocephalic and atraumatic.  Right Ear: External ear normal.     Left Ear: External ear normal.     Nose: Nose normal.     Mouth/Throat:     Pharynx: Oropharynx is clear. Uvula midline. No oropharyngeal exudate or posterior oropharyngeal erythema.  Eyes:     General: Lids are normal.     Conjunctiva/sclera: Conjunctivae normal.     Pupils: Pupils are equal, round, and reactive to light.  Neck:     Musculoskeletal: Normal range of motion and neck supple.     Thyroid: No thyromegaly.     Trachea: Trachea and phonation normal. No tracheal deviation.  Cardiovascular:     Rate and Rhythm: Normal rate and regular rhythm.     Pulses: Normal pulses.          Radial pulses are 2+ on the  right side and 2+ on the left side.       Posterior tibial pulses are 2+ on the right side and 2+ on the left side.     Heart sounds: Normal heart sounds. No murmur. No friction rub. No gallop.   Pulmonary:     Effort: Pulmonary effort is normal. No respiratory distress.     Breath sounds: Normal breath sounds. No stridor. No wheezing, rhonchi or rales.  Chest:     Chest wall: No tenderness.  Abdominal:     General: Bowel sounds are normal. There is no distension.     Palpations: Abdomen is soft. There is no mass.     Tenderness: There is no abdominal tenderness. There is no right CVA tenderness, left CVA tenderness, guarding or rebound.     Hernia: No hernia is present.  Musculoskeletal: Normal range of motion.        General: No deformity.     Right lower leg: No edema.     Left lower leg: No edema.  Lymphadenopathy:     Cervical: No cervical adenopathy.  Skin:    General: Skin is warm and dry.     Capillary Refill: Capillary refill takes less than 2 seconds.     Coloration: Skin is not pale.     Findings: No rash.  Neurological:     Mental Status: She is alert and oriented to person, place, and time.     Motor: No abnormal muscle tone.     Gait: Gait normal.  Psychiatric:        Mood and Affect: Mood normal.        Speech: Speech normal.        Behavior: Behavior normal.        Thought Content: Thought content normal.           Assessment & Plan:   [name] here for 6 month f/up, transferring my my pt panel.     Problem List Items Addressed This Visit      Cardiovascular and Mediastinum   Essential hypertension - Primary    Compliant with meds - wants them separated into two pills BP mildly elevated here today, but she suffers from anxiety - suspect some white coat HTN, she monitors at home and it runs 120/70 usually Return to check labs CMP      Relevant Medications   lisinopril (PRINIVIL,ZESTRIL) 20 MG tablet   hydrochlorothiazide (HYDRODIURIL) 25 MG  tablet   Other Relevant Orders   COMPLETE METABOLIC PANEL WITH GFR     Endocrine   Subclinical hypothyroidism    Hx of with past elevated TSH - she never  wanted to treat it, was scared of taking synthroid. After discussion, she agreed to recheck labs and see. She's had some weight gain TSH and free T4      Relevant Orders   T4, free   TSH     Other   GAD (generalized anxiety disorder)    Well controlled - managed by Psych      Class 1 obesity with body mass index (BMI) of 31.0 to 31.9 in adult    Increased weight - check labs TSH, free T4, lipid panel Increase exercise and decrease caloric intake      Relevant Orders   T4, free   COMPLETE METABOLIC PANEL WITH GFR   CBC with Differential/Platelet   TSH   Lipid panel   Screening for hyperlipidemia   Relevant Orders   COMPLETE METABOLIC PANEL WITH GFR   Lipid panel    Other Visit Diagnoses    Eczema, unspecified type       Relevant Medications   mometasone (ELOCON) 0.1 % cream   Rhinitis, unspecified type       mild nasal sx, OTC meds, f/up as needed     Med refill for derm meds/creams     Danelle Berry, PA-C 05/01/18 11:29 AM

## 2018-05-02 ENCOUNTER — Other Ambulatory Visit: Payer: BLUE CROSS/BLUE SHIELD

## 2018-05-02 DIAGNOSIS — I10 Essential (primary) hypertension: Secondary | ICD-10-CM | POA: Diagnosis not present

## 2018-05-02 DIAGNOSIS — Z13220 Encounter for screening for lipoid disorders: Secondary | ICD-10-CM | POA: Insufficient documentation

## 2018-05-02 DIAGNOSIS — Z6831 Body mass index (BMI) 31.0-31.9, adult: Secondary | ICD-10-CM

## 2018-05-02 DIAGNOSIS — E039 Hypothyroidism, unspecified: Secondary | ICD-10-CM

## 2018-05-02 DIAGNOSIS — E669 Obesity, unspecified: Secondary | ICD-10-CM

## 2018-05-02 DIAGNOSIS — E038 Other specified hypothyroidism: Secondary | ICD-10-CM

## 2018-05-02 HISTORY — DX: Encounter for screening for lipoid disorders: Z13.220

## 2018-05-02 NOTE — Assessment & Plan Note (Signed)
Well controlled - managed by Psych 

## 2018-05-02 NOTE — Assessment & Plan Note (Signed)
Increased weight - check labs TSH, free T4, lipid panel Increase exercise and decrease caloric intake

## 2018-05-02 NOTE — Assessment & Plan Note (Signed)
Well controlled - managed by Psych

## 2018-05-02 NOTE — Assessment & Plan Note (Signed)
Compliant with meds - wants them separated into two pills BP mildly elevated here today, but she suffers from anxiety - suspect some white coat HTN, she monitors at home and it runs 120/70 usually Return to check labs CMP

## 2018-05-02 NOTE — Assessment & Plan Note (Signed)
Hx of with past elevated TSH - she never wanted to treat it, was scared of taking synthroid. After discussion, she agreed to recheck labs and see. She's had some weight gain TSH and free T4

## 2018-05-03 LAB — LIPID PANEL
Cholesterol: 218 mg/dL — ABNORMAL HIGH (ref <200)
HDL: 38 mg/dL — ABNORMAL LOW (ref >=50)
LDL Cholesterol (Calc): 137 mg/dL (calc) — ABNORMAL HIGH
Non-HDL Cholesterol (Calc): 180 mg/dL (calc) — ABNORMAL HIGH (ref <130)
Total CHOL/HDL Ratio: 5.7 (calc) — ABNORMAL HIGH (ref <5.0)
Triglycerides: 277 mg/dL — ABNORMAL HIGH (ref <150)

## 2018-05-03 LAB — COMPLETE METABOLIC PANEL WITH GFR
AG Ratio: 1.8 (calc) (ref 1.0–2.5)
ALKALINE PHOSPHATASE (APISO): 52 U/L (ref 37–153)
ALT: 18 U/L (ref 6–29)
AST: 21 U/L (ref 10–35)
Albumin: 4.3 g/dL (ref 3.6–5.1)
BUN: 12 mg/dL (ref 7–25)
CO2: 29 mmol/L (ref 20–32)
Calcium: 9.5 mg/dL (ref 8.6–10.4)
Chloride: 100 mmol/L (ref 98–110)
Creat: 0.84 mg/dL (ref 0.50–0.99)
GFR, Est African American: 88 mL/min/{1.73_m2} (ref >=60)
GFR, Est Non African American: 76 mL/min/{1.73_m2} (ref >=60)
Globulin: 2.4 g/dL (calc) (ref 1.9–3.7)
Glucose, Bld: 87 mg/dL (ref 65–99)
POTASSIUM: 4.1 mmol/L (ref 3.5–5.3)
Sodium: 139 mmol/L (ref 135–146)
Total Bilirubin: 0.4 mg/dL (ref 0.2–1.2)
Total Protein: 6.7 g/dL (ref 6.1–8.1)

## 2018-05-03 LAB — CBC WITH DIFFERENTIAL/PLATELET
ABSOLUTE MONOCYTES: 490 {cells}/uL (ref 200–950)
Basophils Absolute: 19 cells/uL (ref 0–200)
Basophils Relative: 0.4 %
Eosinophils Absolute: 120 cells/uL (ref 15–500)
Eosinophils Relative: 2.5 %
HCT: 37.1 % (ref 35.0–45.0)
Hemoglobin: 12.8 g/dL (ref 11.7–15.5)
Lymphs Abs: 1502 cells/uL (ref 850–3900)
MCH: 31.5 pg (ref 27.0–33.0)
MCHC: 34.5 g/dL (ref 32.0–36.0)
MCV: 91.4 fL (ref 80.0–100.0)
MPV: 13 fL — ABNORMAL HIGH (ref 7.5–12.5)
Monocytes Relative: 10.2 %
Neutro Abs: 2669 cells/uL (ref 1500–7800)
Neutrophils Relative %: 55.6 %
Platelets: 134 10*3/uL — ABNORMAL LOW (ref 140–400)
RBC: 4.06 10*6/uL (ref 3.80–5.10)
RDW: 12.2 % (ref 11.0–15.0)
Total Lymphocyte: 31.3 %
WBC: 4.8 10*3/uL (ref 3.8–10.8)

## 2018-05-03 LAB — T4, FREE: Free T4: 1 ng/dL (ref 0.8–1.8)

## 2018-05-03 LAB — TSH: TSH: 5.72 mIU/L — ABNORMAL HIGH (ref 0.40–4.50)

## 2018-05-07 ENCOUNTER — Encounter: Payer: Self-pay | Admitting: Family Medicine

## 2018-05-07 NOTE — Progress Notes (Signed)
Labs and decisions about plan for cholesterol and thyroid discussed and sent to pt

## 2018-05-09 ENCOUNTER — Other Ambulatory Visit: Payer: Self-pay | Admitting: Family Medicine

## 2018-05-09 MED ORDER — ATORVASTATIN CALCIUM 10 MG PO TABS: 10.0000 mg | ORAL_TABLET | Freq: Every day | ORAL | 1 refills | Status: DC

## 2018-05-09 MED ORDER — LEVOTHYROXINE SODIUM 25 MCG PO TABS: 25.0000 ug | ORAL_TABLET | Freq: Every day | ORAL | 1 refills | Status: DC

## 2018-05-30 ENCOUNTER — Encounter: Payer: Self-pay | Admitting: Family Medicine

## 2018-05-30 NOTE — Telephone Encounter (Signed)
Marylene Land can you please call this pt and set her up for some kind of visit, last year her sx were UTI, not just over active bladder.  She can come in here quickly and do a UA or we can just do phone visit

## 2018-06-02 ENCOUNTER — Other Ambulatory Visit: Payer: Self-pay

## 2018-08-20 ENCOUNTER — Encounter: Payer: Self-pay | Admitting: Family Medicine

## 2018-08-20 DIAGNOSIS — E039 Hypothyroidism, unspecified: Secondary | ICD-10-CM

## 2018-08-20 DIAGNOSIS — E782 Mixed hyperlipidemia: Secondary | ICD-10-CM

## 2018-08-20 DIAGNOSIS — E038 Other specified hypothyroidism: Secondary | ICD-10-CM

## 2018-08-21 ENCOUNTER — Encounter: Payer: Self-pay | Admitting: Family Medicine

## 2018-08-21 DIAGNOSIS — E782 Mixed hyperlipidemia: Secondary | ICD-10-CM | POA: Insufficient documentation

## 2018-08-21 NOTE — Assessment & Plan Note (Signed)
Start lipitor 10 mg daily at bedtime - prescribed in March, with 3 month f/up and OV with CMP, FLP

## 2018-08-28 ENCOUNTER — Other Ambulatory Visit: Payer: Self-pay

## 2018-08-28 ENCOUNTER — Other Ambulatory Visit: Payer: BLUE CROSS/BLUE SHIELD

## 2018-08-28 DIAGNOSIS — R6889 Other general symptoms and signs: Secondary | ICD-10-CM | POA: Diagnosis not present

## 2018-08-28 DIAGNOSIS — Z20822 Contact with and (suspected) exposure to covid-19: Secondary | ICD-10-CM

## 2018-09-02 LAB — NOVEL CORONAVIRUS, NAA: SARS-CoV-2, NAA: NOT DETECTED

## 2018-09-08 ENCOUNTER — Other Ambulatory Visit: Payer: Self-pay

## 2018-09-09 ENCOUNTER — Encounter: Payer: Self-pay | Admitting: Family Medicine

## 2018-09-09 ENCOUNTER — Ambulatory Visit: Payer: BLUE CROSS/BLUE SHIELD | Admitting: Family Medicine

## 2018-09-09 VITALS — BP 162/90 | HR 100 | Temp 98.3°F | Resp 16 | Ht 66.0 in | Wt 202.0 lb

## 2018-09-09 DIAGNOSIS — R300 Dysuria: Secondary | ICD-10-CM

## 2018-09-09 DIAGNOSIS — R350 Frequency of micturition: Secondary | ICD-10-CM | POA: Diagnosis not present

## 2018-09-09 LAB — URINALYSIS, ROUTINE W REFLEX MICROSCOPIC
Bilirubin Urine: NEGATIVE
Glucose, UA: NEGATIVE
Hyaline Cast: NONE SEEN /LPF
Ketones, ur: NEGATIVE
Nitrite: NEGATIVE
Protein, ur: NEGATIVE
RBC / HPF: NONE SEEN /HPF (ref 0–2)
Specific Gravity, Urine: 1.015 (ref 1.001–1.03)
pH: 6 (ref 5.0–8.0)

## 2018-09-09 LAB — MICROSCOPIC MESSAGE

## 2018-09-09 MED ORDER — CEPHALEXIN 500 MG PO CAPS: 500.0000 mg | ORAL_CAPSULE | Freq: Three times a day (TID) | ORAL | 0 refills | Status: DC

## 2018-09-09 NOTE — Progress Notes (Signed)
Subjective:    Patient ID: Elizabeth Cobb, female    DOB: 03/01/1960, 58 y.o.   MRN: [id number]  HPI Patient states that for the last few months, she has been experiencing increased urinary frequency.  She will have the sensation that she has to urinate.  She will go to the bathroom, void, and a few minutes later will feel like she has to go again.  She also has some mild dysuria.  She denies any visible hematuria.  She denies any foul odor.  She denies any fevers or chills.  She denies any pelvic pain.  She denies any low back pain or CVA tenderness.  Urinalysis today does show +2 leukocyte esterase and numerous white blood cells on microscopy Past Medical History:  Diagnosis Date  . Screening for hyperlipidemia 05/02/2018   No past surgical history on file. Current Outpatient Medications on File Prior to Visit  Medication Sig Dispense Refill  . atorvastatin (LIPITOR) 10 MG tablet Take 1 tablet (10 mg total) by mouth at bedtime. 90 tablet 1  . b complex vitamins tablet Take 1 tablet by mouth daily.    . Cholecalciferol (VITAMIN D3) 2000 units TABS Take by mouth.    . diazepam (VALIUM) 10 MG tablet Take 1 tablet (10 mg total) by mouth every 8 (eight) hours as needed. 90 tablet 5  . FLUoxetine (PROZAC) 20 MG tablet Take 2 tablets (40 mg total) by mouth daily. 180 tablet 1  . fluticasone (FLONASE) 50 MCG/ACT nasal spray Place 2 sprays into both nostrils daily. 16 g 11  . hydrochlorothiazide (HYDRODIURIL) 25 MG tablet Take 1 tablet (25 mg total) by mouth daily. 90 tablet 3  . levothyroxine (SYNTHROID, LEVOTHROID) 25 MCG tablet Take 1 tablet (25 mcg total) by mouth daily before breakfast. 90 tablet 1  . lisinopril (PRINIVIL,ZESTRIL) 20 MG tablet Take 1 tablet (20 mg total) by mouth daily. 90 tablet 3  . mometasone (ELOCON) 0.1 % cream Apply 1 application topically daily. Use on hands and behind ears PRN 45 g 2  . Omega-3 Fatty Acids (OMEGA 3 PO) Take by mouth.    . pyridOXINE (VITAMIN B-6) 100 MG  tablet Take 100 mg by mouth daily.     No current facility-administered medications on file prior to visit.    No Known Allergies Social History   Socioeconomic History  . Marital status: Married    Spouse name: Not on file  . Number of children: Not on file  . Years of education: Not on file  . Highest education level: Not on file  Occupational History  . Not on file  Social Needs  . Financial resource strain: Not on file  . Food insecurity    Worry: Not on file    Inability: Not on file  . Transportation needs    Medical: Not on file    Non-medical: Not on file  Tobacco Use  . Smoking status: Current Every Day Smoker    Types: E-cigarettes  . Smokeless tobacco: Never Used  Substance and Sexual Activity  . Alcohol use: Yes    Alcohol/week: 2.0 standard drinks    Types: 2 Cans of beer per week    Comment: per month.  . Drug use: Never  . Sexual activity: Not on file  Lifestyle  . Physical activity    Days per week: Not on file    Minutes per session: Not on file  . Stress: Not on file  Relationships  . Social connections  Talks on phone: Not on file    Gets together: Not on file    Attends religious service: Not on file    Active member of club or organization: Not on file    Attends meetings of clubs or organizations: Not on file    Relationship status: Not on file  . Intimate partner violence    Fear of current or ex partner: Not on file    Emotionally abused: Not on file    Physically abused: Not on file    Forced sexual activity: Not on file  Other Topics Concern  . Not on file  Social History Narrative  . Not on file      Review of Systems  All other systems reviewed and are negative.      Objective:   Physical Exam Vitals signs reviewed.  Constitutional:      Appearance: Normal appearance. She is not ill-appearing or toxic-appearing.  Cardiovascular:     Rate and Rhythm: Normal rate and regular rhythm.  Pulmonary:     Effort: Pulmonary  effort is normal.     Breath sounds: Normal breath sounds.  Abdominal:     Tenderness: There is no abdominal tenderness.  Neurological:     Mental Status: She is alert.           Assessment & Plan:  The primary encounter diagnosis was Urine frequency. A diagnosis of Dysuria was also pertinent to this visit. Although atypical in its smoldering history and duration, her urinalysis supports a urinary tract infection.  History sounds more like overactive bladder.  I will treat a urinary tract infection and send a urine culture.  Patient will take Keflex 500 mg 3 times daily for the next 7 days.  Await the results of urine culture

## 2018-09-11 LAB — URINE CULTURE
MICRO NUMBER:: 734792
SPECIMEN QUALITY:: ADEQUATE

## 2018-10-10 ENCOUNTER — Ambulatory Visit (INDEPENDENT_AMBULATORY_CARE_PROVIDER_SITE_OTHER): Payer: BC Managed Care – PPO | Admitting: Physician Assistant

## 2018-10-10 ENCOUNTER — Other Ambulatory Visit: Payer: Self-pay | Admitting: Physician Assistant

## 2018-10-10 ENCOUNTER — Other Ambulatory Visit: Payer: Self-pay

## 2018-10-10 ENCOUNTER — Encounter: Payer: Self-pay | Admitting: Physician Assistant

## 2018-10-10 DIAGNOSIS — F411 Generalized anxiety disorder: Secondary | ICD-10-CM | POA: Diagnosis not present

## 2018-10-10 DIAGNOSIS — F331 Major depressive disorder, recurrent, moderate: Secondary | ICD-10-CM

## 2018-10-10 MED ORDER — FLUOXETINE HCL 20 MG PO TABS: 60.0000 mg | ORAL_TABLET | Freq: Every day | ORAL | 1 refills | Status: DC

## 2018-10-10 NOTE — Progress Notes (Signed)
[facility]  Patient ID: Elizabeth KindredBeth Kovatch,  MRN: [id number]  PCP: Donita BrooksPickard, Warren T, MD  Date of Evaluation: 10/10/2018 Time spent:15 minutes  Chief Complaint:  Chief Complaint    Anxiety; Depression; Follow-up     Virtual Visit via Telephone Note  I connected with patient by a video enabled telemedicine application or telephone, with their informed consent, and verified patient privacy and that I am speaking with the correct person using two identifiers.  I am private, in my home and the patient is home.   I discussed the limitations, risks, security and privacy concerns of performing an evaluation and management service by telephone and the availability of in person appointments. I also discussed with the patient that there may be a patient responsible charge related to this service. The patient expressed understanding and agreed to proceed.   I discussed the assessment and treatment plan with the patient. The patient was provided an opportunity to ask questions and all were answered. The patient agreed with the plan and demonstrated an understanding of the instructions.   The patient was advised to call back or seek an in-person evaluation if the symptoms worsen or if the condition fails to improve as anticipated.  I provided15 minutes of non-face-to-face time during this encounter.  HISTORY/CURRENT STATUS: HPI For routine med check.  Was seen 6 months ago.  Doing well since then. 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 Sleeps good.  Anxiety is well-controlled.  Not needing Valium too often. Maybe a few times a week.   Patient denies loss of interest in usual activities and is able to enjoy things.  Denies decreased energy or motivation.  Appetite has not changed.  No extreme sadness, tearfulness, or feelings of hopelessness.  Denies any changes in concentration, making decisions or remembering things.  She had increased the Prozac up to 60 mg sometime ago and I  did not have that correct in my records.  Denies suicidal or homicidal thoughts.  Patient denies increased energy with decreased need for sleep, no increased talkativeness, no racing thoughts, no impulsivity or risky behaviors, no increased spending, no increased libido, no grandiosity.  Denies dizziness, syncope, seizures, numbness, tingling, tremor, tics, unsteady gait, slurred speech, confusion. Denies muscle or joint pain, stiffness, or dystonia.  Individual Medical History/ Review of Systems: Changes? :No    Past medications for mental health diagnoses include: Chantix, Prozac, Xanax, Remeron, Klonopin, Wellbutrin caused increased agitation.   Allergies: Patient has no known allergies.  Current Medications:  Current Outpatient Medications:  .  atorvastatin (LIPITOR) 10 MG tablet, Take 1 tablet (10 mg total) by mouth at bedtime., Disp: 90 tablet, Rfl: 1 .  b complex vitamins tablet, Take 1 tablet by mouth daily., Disp: , Rfl:  .  Cholecalciferol (VITAMIN D3) 2000 units TABS, Take by mouth., Disp: , Rfl:  .  diazepam (VALIUM) 10 MG tablet, Take 1 tablet (10 mg total) by mouth every 8 (eight) hours as needed., Disp: 90 tablet, Rfl: 5 .  FLUoxetine (PROZAC) 20 MG tablet, Take 3 tablets (60 mg total) by mouth daily., Disp: 270 tablet, Rfl: 1 .  fluticasone (FLONASE) 50 MCG/ACT nasal spray, Place 2 sprays into both nostrils daily., Disp: 16 g, Rfl: 11 .  hydrochlorothiazide (HYDRODIURIL) 25 MG tablet, Take 1 tablet (25 mg total) by mouth daily., Disp: 90 tablet, Rfl: 3 .  levothyroxine (SYNTHROID, LEVOTHROID) 25 MCG tablet, Take 1 tablet (25 mcg total) by mouth daily before breakfast., Disp: 90 tablet, Rfl:  1 .  lisinopril (PRINIVIL,ZESTRIL) 20 MG tablet, Take 1 tablet (20 mg total) by mouth daily., Disp: 90 tablet, Rfl: 3 .  mometasone (ELOCON) 0.1 % cream, Apply 1 application topically daily. Use on hands and behind ears PRN, Disp: 45 g, Rfl: 2 .  Omega-3 Fatty Acids (OMEGA 3 PO), Take by  mouth., Disp: , Rfl:  .  pyridOXINE (VITAMIN B-6) 100 MG tablet, Take 100 mg by mouth daily., Disp: , Rfl:  .  cephALEXin (KEFLEX) 500 MG capsule, Take 1 capsule (500 mg total) by mouth 3 (three) times daily. (Patient not taking: Reported on 10/10/2018), Disp: 21 capsule, Rfl: 0 Medication Side Effects: none  Family Medical/ Social History: Changes? No  MENTAL HEALTH EXAM:  There were no vitals taken for this visit.There is no height or weight on file to calculate BMI.  General Appearance: unable to assess  Eye Contact:  unable to assess  Speech:  Clear and Coherent  Volume:  Normal  Mood:  Euthymic  Affect:  unable to assess  Thought Process:  Goal Directed  Orientation:  Full (Time, Place, and Person)  Thought Content: Logical   Suicidal Thoughts:  No  Homicidal Thoughts:  No  Memory:  WNL  Judgement:  Good  Insight:  Good  Psychomotor Activity:  unable to assess  Concentration:  Concentration: Good  Recall:  Good  Fund of Knowledge: Good  Language: Good  Assets:  Desire for Improvement  ADL's:  Intact  Cognition: WNL  Prognosis:  Good    DIAGNOSES:    ICD-10-CM   1. Major depressive disorder, recurrent episode, moderate (HCC)  F33.1   2. Generalized anxiety disorder  F41.1     Receiving Psychotherapy: No    RECOMMENDATIONS:  Continue Prozac 20 mg, 3 po qd. (she prefers this over the 60 mg) Cont Valium 10mg  1/2-1 tid prn  Return in 6 months.  Donnal Moat, PA-C   This record has been created using [employer].  Chart creation errors have been sought, but may not always have been located and corrected. Such creation errors do not reflect on the standard of medical care.

## 2018-10-13 NOTE — Telephone Encounter (Signed)
Last appt 10/10/2018

## 2018-12-19 ENCOUNTER — Other Ambulatory Visit: Payer: Self-pay | Admitting: Family Medicine

## 2018-12-23 ENCOUNTER — Other Ambulatory Visit: Payer: Self-pay | Admitting: Family Medicine

## 2019-01-05 ENCOUNTER — Ambulatory Visit (INDEPENDENT_AMBULATORY_CARE_PROVIDER_SITE_OTHER): Payer: Self-pay | Admitting: Family Medicine

## 2019-01-05 ENCOUNTER — Other Ambulatory Visit: Payer: Self-pay

## 2019-01-05 ENCOUNTER — Encounter: Payer: Self-pay | Admitting: Family Medicine

## 2019-01-05 ENCOUNTER — Other Ambulatory Visit: Payer: Self-pay | Admitting: Family Medicine

## 2019-01-05 VITALS — BP 162/94 | HR 88 | Temp 97.2°F | Resp 16 | Ht 66.0 in | Wt 202.0 lb

## 2019-01-05 DIAGNOSIS — E7800 Pure hypercholesterolemia, unspecified: Secondary | ICD-10-CM | POA: Diagnosis not present

## 2019-01-05 DIAGNOSIS — E039 Hypothyroidism, unspecified: Secondary | ICD-10-CM

## 2019-01-05 DIAGNOSIS — Z23 Encounter for immunization: Secondary | ICD-10-CM

## 2019-01-05 NOTE — Addendum Note (Signed)
Addended by: Shary Decamp B on: 01/05/2019 04:17 PM   Modules accepted: Orders

## 2019-01-05 NOTE — Progress Notes (Signed)
Subjective:    Patient ID: Elizabeth Cobb, female    DOB: [birth year], 58 y.o.   MRN: [id number]  HPI  Patient is here today to follow-up her hypertension.  Her blood pressure is elevated today.  However she has whitecoat syndrome.  Her blood pressure at home has been between 101 120/50-60 consistently.  I checked her blood pressure manually as well as with her own cuff and they are consistent indicating that she does have whitecoat syndrome.  She denies any chest pain shortness of breath or dyspnea on exertion.  She also has hyperlipidemia for which she takes Lipitor.  She denies any myalgias or right upper quadrant pain.  She has mild subclinical hypothyroidism however my partner started her on levothyroxine 25 daily.  She is due to recheck her TSH.  Past Medical History:  Diagnosis Date  . Screening for hyperlipidemia 05/02/2018   No past surgical history on file. Current Outpatient Medications on File Prior to Visit  Medication Sig Dispense Refill  . b complex vitamins tablet Take 1 tablet by mouth daily.    . Cholecalciferol (VITAMIN D3) 2000 units TABS Take by mouth.    . diazepam (VALIUM) 10 MG tablet TAKE 1 TABLET EVERY 8 HOURS AS NEEDED. 90 tablet 5  . FLUoxetine (PROZAC) 20 MG tablet Take 3 tablets (60 mg total) by mouth daily. 270 tablet 1  . fluticasone (FLONASE) 50 MCG/ACT nasal spray Place 2 sprays into both nostrils daily. 16 g 11  . hydrochlorothiazide (HYDRODIURIL) 25 MG tablet TAKE (1) TABLET BY MOUTH ONCE DAILY. 90 tablet 0  . levothyroxine (SYNTHROID, LEVOTHROID) 25 MCG tablet Take 1 tablet (25 mcg total) by mouth daily before breakfast. 90 tablet 1  . lisinopril (ZESTRIL) 20 MG tablet TAKE ONE TABLET BY MOUTH ONCE DAILY. 90 tablet 0  . loratadine (CLARITIN) 10 MG tablet Take 10 mg by mouth daily.    . mometasone (ELOCON) 0.1 % cream Apply 1 application topically daily. Use on hands and behind ears PRN 45 g 2  . Omega-3 Fatty Acids (OMEGA 3 PO) Take by mouth.    .  pyridOXINE (VITAMIN B-6) 100 MG tablet Take 100 mg by mouth daily.    Marland Kitchen atorvastatin (LIPITOR) 10 MG tablet Take 1 tablet (10 mg total) by mouth at bedtime. (Patient not taking: Reported on 01/05/2019) 90 tablet 1   No current facility-administered medications on file prior to visit.    No Known Allergies Social History   Socioeconomic History  . Marital status: Married    Spouse name: Not on file  . Number of children: Not on file  . Years of education: Not on file  . Highest education level: Not on file  Occupational History  . Not on file  Social Needs  . Financial resource strain: Not on file  . Food insecurity    Worry: Not on file    Inability: Not on file  . Transportation needs    Medical: Not on file    Non-medical: Not on file  Tobacco Use  . Smoking status: Current Every Day Smoker    Types: E-cigarettes  . Smokeless tobacco: Never Used  Substance and Sexual Activity  . Alcohol use: Yes    Alcohol/week: 2.0 standard drinks    Types: 2 Cans of beer per week    Comment: per month.  . Drug use: Never  . Sexual activity: Not on file  Lifestyle  . Physical activity    Days per week: Not on file  Minutes per session: Not on file  . Stress: Not on file  Relationships  . Social Musician on phone: Not on file    Gets together: Not on file    Attends religious service: Not on file    Active member of club or organization: Not on file    Attends meetings of clubs or organizations: Not on file    Relationship status: Not on file  . Intimate partner violence    Fear of current or ex partner: Not on file    Emotionally abused: Not on file    Physically abused: Not on file    Forced sexual activity: Not on file  Other Topics Concern  . Not on file  Social History Narrative  . Not on file      Review of Systems  All other systems reviewed and are negative.      Objective:   Physical Exam Vitals signs reviewed.  Constitutional:       Appearance: Normal appearance. She is not ill-appearing or toxic-appearing.  Cardiovascular:     Rate and Rhythm: Normal rate and regular rhythm.  Pulmonary:     Effort: Pulmonary effort is normal.     Breath sounds: Normal breath sounds.  Abdominal:     Tenderness: There is no abdominal tenderness.  Neurological:     Mental Status: She is alert.           Assessment & Plan:  Pure hypercholesterolemia - Plan: CBC with Differential, COMPLETE METABOLIC PANEL WITH GFR, Lipid Panel  Hypothyroidism, unspecified type - Plan: TSH  Although elevated here, her blood pressures at home are outstanding.  Make no changes in her antihypertensives however I will check a CMP and a fasting lipid panel.  Her goal LDL cholesterol is less than 100 if possible.  Patient denies any myalgias or right upper quadrant pain on the medication.  Patient does agree to receive her flu shot today.  Also check TSH to monitor the management of her hypothyroidism.

## 2019-01-06 ENCOUNTER — Other Ambulatory Visit: Payer: Self-pay | Admitting: Family Medicine

## 2019-01-06 LAB — CBC WITH DIFFERENTIAL/PLATELET
Absolute Monocytes: 561 cells/uL (ref 200–950)
Basophils Absolute: 43 cells/uL (ref 0–200)
Basophils Relative: 0.7 %
Eosinophils Absolute: 122 cells/uL (ref 15–500)
Eosinophils Relative: 2 %
HCT: 38.2 % (ref 35.0–45.0)
Hemoglobin: 13 g/dL (ref 11.7–15.5)
Lymphs Abs: 1848 cells/uL (ref 850–3900)
MCH: 31.2 pg (ref 27.0–33.0)
MCHC: 34 g/dL (ref 32.0–36.0)
MCV: 91.6 fL (ref 80.0–100.0)
MPV: 12.2 fL (ref 7.5–12.5)
Monocytes Relative: 9.2 %
Neutro Abs: 3526 cells/uL (ref 1500–7800)
Neutrophils Relative %: 57.8 %
Platelets: 160 10*3/uL (ref 140–400)
RBC: 4.17 10*6/uL (ref 3.80–5.10)
RDW: 12.2 % (ref 11.0–15.0)
Total Lymphocyte: 30.3 %
WBC: 6.1 10*3/uL (ref 3.8–10.8)

## 2019-01-06 LAB — COMPLETE METABOLIC PANEL WITH GFR
AG Ratio: 1.8 (calc) (ref 1.0–2.5)
ALT: 25 U/L (ref 6–29)
AST: 26 U/L (ref 10–35)
Albumin: 4.6 g/dL (ref 3.6–5.1)
Alkaline phosphatase (APISO): 57 U/L (ref 37–153)
BUN: 11 mg/dL (ref 7–25)
CO2: 29 mmol/L (ref 20–32)
Calcium: 10 mg/dL (ref 8.6–10.4)
Chloride: 97 mmol/L — ABNORMAL LOW (ref 98–110)
Creat: 0.82 mg/dL (ref 0.50–0.99)
GFR, Est African American: 90 mL/min/{1.73_m2} (ref >=60)
GFR, Est Non African American: 78 mL/min/{1.73_m2} (ref >=60)
Globulin: 2.5 g/dL (calc) (ref 1.9–3.7)
Glucose, Bld: 93 mg/dL (ref 65–99)
Potassium: 3.8 mmol/L (ref 3.5–5.3)
Sodium: 138 mmol/L (ref 135–146)
Total Bilirubin: 0.4 mg/dL (ref 0.2–1.2)
Total Protein: 7.1 g/dL (ref 6.1–8.1)

## 2019-01-06 LAB — LIPID PANEL
Cholesterol: 233 mg/dL — ABNORMAL HIGH (ref <200)
HDL: 46 mg/dL — ABNORMAL LOW (ref >=50)
LDL Cholesterol (Calc): 149 mg/dL (calc) — ABNORMAL HIGH
Non-HDL Cholesterol (Calc): 187 mg/dL (calc) — ABNORMAL HIGH (ref <130)
Total CHOL/HDL Ratio: 5.1 (calc) — ABNORMAL HIGH (ref <5.0)
Triglycerides: 250 mg/dL — ABNORMAL HIGH (ref <150)

## 2019-01-06 LAB — TSH: TSH: 4.05 mIU/L (ref 0.40–4.50)

## 2019-01-06 MED ORDER — ATORVASTATIN CALCIUM 40 MG PO TABS: 40.0000 mg | ORAL_TABLET | Freq: Every day | ORAL | 3 refills | Status: DC

## 2019-01-06 MED ORDER — LEVOTHYROXINE SODIUM 25 MCG PO TABS: 25.0000 ug | ORAL_TABLET | Freq: Every day | ORAL | 3 refills | Status: DC

## 2019-01-12 ENCOUNTER — Other Ambulatory Visit: Payer: Self-pay | Admitting: Physician Assistant

## 2019-04-07 ENCOUNTER — Encounter: Payer: Self-pay | Admitting: Family Medicine

## 2019-04-15 ENCOUNTER — Other Ambulatory Visit: Payer: Self-pay | Admitting: Physician Assistant

## 2019-04-15 ENCOUNTER — Ambulatory Visit (INDEPENDENT_AMBULATORY_CARE_PROVIDER_SITE_OTHER): Payer: BC Managed Care – PPO | Admitting: Nurse Practitioner

## 2019-04-15 ENCOUNTER — Other Ambulatory Visit: Payer: Self-pay | Admitting: Family Medicine

## 2019-04-15 ENCOUNTER — Other Ambulatory Visit: Payer: Self-pay

## 2019-04-15 VITALS — BP 148/86 | HR 97 | Temp 98.9°F | Resp 18 | Ht 66.0 in | Wt 204.8 lb

## 2019-04-15 DIAGNOSIS — N3000 Acute cystitis without hematuria: Secondary | ICD-10-CM | POA: Diagnosis not present

## 2019-04-15 DIAGNOSIS — I10 Essential (primary) hypertension: Secondary | ICD-10-CM

## 2019-04-15 DIAGNOSIS — R399 Unspecified symptoms and signs involving the genitourinary system: Secondary | ICD-10-CM

## 2019-04-15 LAB — URINALYSIS, ROUTINE W REFLEX MICROSCOPIC
Bacteria, UA: NONE SEEN /HPF
Bilirubin Urine: NEGATIVE
Glucose, UA: NEGATIVE
Hgb urine dipstick: NEGATIVE
Hyaline Cast: NONE SEEN /LPF
Ketones, ur: NEGATIVE
Nitrite: NEGATIVE
Protein, ur: NEGATIVE
RBC / HPF: NONE SEEN /HPF (ref 0–2)
Specific Gravity, Urine: 1.01 (ref 1.001–1.03)
pH: 6 (ref 5.0–8.0)

## 2019-04-15 LAB — MICROSCOPIC MESSAGE

## 2019-04-15 MED ORDER — NITROFURANTOIN MONOHYD MACRO 100 MG PO CAPS: 100.0000 mg | ORAL_CAPSULE | Freq: Two times a day (BID) | ORAL | 0 refills | Status: DC

## 2019-04-15 MED ORDER — LISINOPRIL 20 MG PO TABS: 20.0000 mg | ORAL_TABLET | Freq: Every day | ORAL | 0 refills | Status: DC

## 2019-04-15 MED ORDER — HYDROCHLOROTHIAZIDE 25 MG PO TABS: ORAL_TABLET | ORAL | 0 refills | Status: DC

## 2019-04-15 NOTE — Progress Notes (Signed)
Acute Office Visit  Subjective:    Patient ID: Elizabeth Cobb, female    DOB: 04/09/1960, 59 y.o.   MRN: [id number]  Chief Complaint  Patient presents with  . Urinary Tract Infection    urgency, lower back pain, started x2 weeks, cranberry was taken    HPI Elizabeth Cobb is a 59 y.o. female, presents to clinic with uti sxs. Sxs of urinary urgency, polyuria, and low back ache that started 2 weeks ago. She took otc urine test strip she says was positive. Treatment with cranberry juice and some vitamin for the bladder that did not improve sxs.  No fever/chills, other GU/GI sxs, other pain, sob, edema, or falls.   Past Medical History:  Diagnosis Date  . Screening for hyperlipidemia 05/02/2018    Family History  Problem Relation Age of Onset  . Cancer Mother        Some type of Leukemia  . Lung cancer Father   . Bipolar disorder Sister     Social History   Socioeconomic History  . Marital status: Married    Spouse name: Not on file  . Number of children: Not on file  . Years of education: Not on file  . Highest education level: Not on file  Occupational History  . Not on file  Tobacco Use  . Smoking status: Current Every Day Smoker    Types: E-cigarettes  . Smokeless tobacco: Never Used  Substance and Sexual Activity  . Alcohol use: Yes    Alcohol/week: 2.0 standard drinks    Types: 2 Cans of beer per week    Comment: per month.  . Drug use: Never  . Sexual activity: Not on file  Other Topics Concern  . Not on file  Social History Narrative  . Not on file   Social Determinants of Health   Financial Resource Strain:   . Difficulty of Paying Living Expenses: Not on file  Food Insecurity:   . Worried About Charity fundraiser in the Last Year: Not on file  . Ran Out of Food in the Last Year: Not on file  Transportation Needs:   . Lack of Transportation (Medical): Not on file  . Lack of Transportation (Non-Medical): Not on file  Physical Activity:   . Days of  Exercise per Week: Not on file  . Minutes of Exercise per Session: Not on file  Stress:   . Feeling of Stress : Not on file  Social Connections:   . Frequency of Communication with Friends and Family: Not on file  . Frequency of Social Gatherings with Friends and Family: Not on file  . Attends Religious Services: Not on file  . Active Member of Clubs or Organizations: Not on file  . Attends Archivist Meetings: Not on file  . Marital Status: Not on file  Intimate Partner Violence:   . Fear of Current or Ex-Partner: Not on file  . Emotionally Abused: Not on file  . Physically Abused: Not on file  . Sexually Abused: Not on file    Outpatient Medications Prior to Visit  Medication Sig Dispense Refill  . atorvastatin (LIPITOR) 40 MG tablet Take 1 tablet (40 mg total) by mouth daily. 90 tablet 3  . b complex vitamins tablet Take 1 tablet by mouth daily.    . Cholecalciferol (VITAMIN D3) 2000 units TABS Take by mouth.    . diazepam (VALIUM) 10 MG tablet TAKE 1 TABLET EVERY 8 HOURS AS NEEDED. 90 tablet 5  .  FLUoxetine (PROZAC) 20 MG capsule TAKE 3 CAPSULES BY MOUTH ONCE DAILY. 270 capsule 0  . fluticasone (FLONASE) 50 MCG/ACT nasal spray Place 2 sprays into both nostrils daily. 16 g 11  . levothyroxine (SYNTHROID) 25 MCG tablet Take 1 tablet (25 mcg total) by mouth daily before breakfast. 90 tablet 3  . loratadine (CLARITIN) 10 MG tablet Take 10 mg by mouth daily.    . mometasone (ELOCON) 0.1 % cream Apply 1 application topically daily. Use on hands and behind ears PRN 45 g 2  . Omega-3 Fatty Acids (OMEGA 3 PO) Take by mouth.    . pyridOXINE (VITAMIN B-6) 100 MG tablet Take 100 mg by mouth daily.    . hydrochlorothiazide (HYDRODIURIL) 25 MG tablet TAKE (1) TABLET BY MOUTH ONCE DAILY. 90 tablet 0  . lisinopril (ZESTRIL) 20 MG tablet TAKE ONE TABLET BY MOUTH ONCE DAILY. 90 tablet 0  . FLUoxetine (PROZAC) 20 MG tablet Take 3 tablets (60 mg total) by mouth daily. 270 tablet 1   No  facility-administered medications prior to visit.    No Known Allergies  Review of Systems  All other systems reviewed and are negative.      Objective:    Physical Exam Vitals and nursing note reviewed.  Constitutional:      Appearance: Normal appearance. She is not ill-appearing or toxic-appearing.  HENT:     Head: Normocephalic.  Eyes:     Extraocular Movements: Extraocular movements intact.     Conjunctiva/sclera: Conjunctivae normal.     Pupils: Pupils are equal, round, and reactive to light.  Cardiovascular:     Rate and Rhythm: Normal rate.  Pulmonary:     Effort: Pulmonary effort is normal.  Abdominal:     General: Abdomen is flat.     Palpations: Abdomen is soft.     Tenderness: There is no right CVA tenderness or left CVA tenderness.  Musculoskeletal:     Cervical back: Neck supple.     Right lower leg: No edema.     Left lower leg: No edema.  Skin:    General: Skin is warm and dry.  Neurological:     General: No focal deficit present.     Mental Status: She is alert and oriented to person, place, and time.  Psychiatric:        Mood and Affect: Mood normal.        Behavior: Behavior normal.        Thought Content: Thought content normal.        Judgment: Judgment normal.     BP (!) 148/86 (BP Location: Left Arm, Patient Position: Sitting, Cuff Size: Normal)   Pulse 97   Temp 98.9 F (37.2 C) (Oral)   Resp 18   Ht 5\' 6"  (1.676 m)   Wt 204 lb 12.8 oz (92.9 kg)   SpO2 97%   BMI 33.06 kg/m  Wt Readings from Last 3 Encounters:  04/15/19 204 lb 12.8 oz (92.9 kg)  01/05/19 202 lb (91.6 kg)  09/09/18 202 lb (91.6 kg)    Health Maintenance Due  Topic Date Due  . Hepatitis C Screening  October 20, 1960  . HIV Screening  02/08/1973  . PAP SMEAR-Modifier  02/09/1979  . COLONOSCOPY  02/09/2008    Lab Results  Component Value Date   TSH 4.05 01/05/2019   Lab Results  Component Value Date   WBC 6.1 01/05/2019   HGB 13.0 01/05/2019   HCT 38.2  01/05/2019   MCV 91.6 01/05/2019  PLT 160 01/05/2019   Lab Results  Component Value Date   NA 138 01/05/2019   K 3.8 01/05/2019   CO2 29 01/05/2019   GLUCOSE 93 01/05/2019   BUN 11 01/05/2019   CREATININE 0.82 01/05/2019   BILITOT 0.4 01/05/2019   AST 26 01/05/2019   ALT 25 01/05/2019   PROT 7.1 01/05/2019   CALCIUM 10.0 01/05/2019   Lab Results  Component Value Date   CHOL 233 (H) 01/05/2019   Lab Results  Component Value Date   HDL 46 (L) 01/05/2019   Lab Results  Component Value Date   LDLCALC 149 (H) 01/05/2019   Lab Results  Component Value Date   TRIG 250 (H) 01/05/2019   Lab Results  Component Value Date   CHOLHDL 5.1 (H) 01/05/2019   No results found for: HGBA1C     Assessment & Plan:  Antibiotic for treatment of Urinary Tract infection. Refill for 90 days: blood pressure managing medications. Follow up with regular PCP. BP today stable.  Problem List Items Addressed This Visit      Cardiovascular and Mediastinum   Essential hypertension   Relevant Medications   hydrochlorothiazide (HYDRODIURIL) 25 MG tablet   lisinopril (ZESTRIL) 20 MG tablet    Other Visit Diagnoses    Acute cystitis without hematuria    -  Primary   Relevant Medications   nitrofurantoin, macrocrystal-monohydrate, (MACROBID) 100 MG capsule   UTI symptoms       Relevant Orders   Urinalysis, Routine w reflex microscopic (Completed)      Meds ordered this encounter  Medications  . hydrochlorothiazide (HYDRODIURIL) 25 MG tablet    Sig: TAKE (1) TABLET BY MOUTH ONCE DAILY.    Dispense:  90 tablet    Refill:  0  . lisinopril (ZESTRIL) 20 MG tablet    Sig: Take 1 tablet (20 mg total) by mouth daily.    Dispense:  90 tablet    Refill:  0  . nitrofurantoin, macrocrystal-monohydrate, (MACROBID) 100 MG capsule    Sig: Take 1 capsule (100 mg total) by mouth 2 (two) times daily for 7 days.    Dispense:  14 capsule    Refill:  0   Follow up as needed for non resolving or  worsening symptoms  Elmore Guise, FNP

## 2019-04-16 NOTE — Telephone Encounter (Signed)
Next apt 04/06

## 2019-04-24 ENCOUNTER — Encounter: Payer: Self-pay | Admitting: Family Medicine

## 2019-04-24 DIAGNOSIS — N3000 Acute cystitis without hematuria: Secondary | ICD-10-CM

## 2019-04-24 MED ORDER — NITROFURANTOIN MONOHYD MACRO 100 MG PO CAPS: 100.0000 mg | ORAL_CAPSULE | Freq: Two times a day (BID) | ORAL | 0 refills | Status: AC

## 2019-04-24 NOTE — Telephone Encounter (Signed)
MD sent in additional days for patient.   Please advise on suppression therapy.

## 2019-05-07 ENCOUNTER — Other Ambulatory Visit: Payer: Self-pay | Admitting: Family Medicine

## 2019-05-12 ENCOUNTER — Other Ambulatory Visit: Payer: Self-pay

## 2019-05-12 ENCOUNTER — Encounter: Payer: Self-pay | Admitting: Physician Assistant

## 2019-05-12 ENCOUNTER — Ambulatory Visit (INDEPENDENT_AMBULATORY_CARE_PROVIDER_SITE_OTHER): Payer: BC Managed Care – PPO | Admitting: Physician Assistant

## 2019-05-12 DIAGNOSIS — F411 Generalized anxiety disorder: Secondary | ICD-10-CM | POA: Diagnosis not present

## 2019-05-12 DIAGNOSIS — F331 Major depressive disorder, recurrent, moderate: Secondary | ICD-10-CM | POA: Diagnosis not present

## 2019-05-12 NOTE — Progress Notes (Signed)
[facility]  Patient ID: Elizabeth Cobb,  MRN: [id number]  PCP: Donita Brooks, MD  Date of Evaluation: 05/12/2019 Time spent:20 minutes  Chief Complaint:  Chief Complaint    Anxiety; Depression      HISTORY/CURRENT STATUS: HPI For routine med check.  Doing well.  Isn't working now d/t covid.  Has had a bit more anxiety because she is at home all the time with her husband.  She has needed the Valium a little more often usually twice a day and it is still effective.  She has more of a generalized sense of anxiety, not panic attacks.  States her co-pay has gone up to $120 to see a specialist.  She ask if I could decrease her visits to once a year as long as she is doing well.  Patient denies loss of interest in usual activities and is able to enjoy things.  Denies decreased energy or motivation.  Appetite has not changed.  No extreme sadness, tearfulness, or feelings of hopelessness.  Denies any changes in concentration, making decisions or remembering things.  Denies suicidal or homicidal thoughts.  Denies dizziness, syncope, seizures, numbness, tingling, tremor, tics, unsteady gait, slurred speech, confusion. Denies muscle or joint pain, stiffness, or dystonia.  Individual Medical History/ Review of Systems: Changes? :No    Past medications for mental health diagnoses include: Chantix, Prozac, Xanax, Remeron, Klonopin, Wellbutrin caused increased agitation.  Allergies: Patient has no known allergies.  Current Medications:  Current Outpatient Medications:  .  atorvastatin (LIPITOR) 40 MG tablet, Take 1 tablet (40 mg total) by mouth daily., Disp: 90 tablet, Rfl: 3 .  b complex vitamins tablet, Take 1 tablet by mouth daily., Disp: , Rfl:  .  Cholecalciferol (VITAMIN D3) 2000 units TABS, Take by mouth., Disp: , Rfl:  .  diazepam (VALIUM) 10 MG tablet, TAKE 1 TABLET EVERY 8 HOURS AS NEEDED., Disp: 90 tablet, Rfl: 0 .  FLUoxetine (PROZAC) 20 MG capsule, TAKE 3  CAPSULES BY MOUTH ONCE DAILY., Disp: 270 capsule, Rfl: 0 .  fluticasone (FLONASE) 50 MCG/ACT nasal spray, INHALE 2 SPRAYS IN EACH NOSTRIL ONCE DAILY., Disp: 16 g, Rfl: 3 .  hydrochlorothiazide (HYDRODIURIL) 25 MG tablet, TAKE (1) TABLET BY MOUTH ONCE DAILY., Disp: 90 tablet, Rfl: 0 .  levothyroxine (SYNTHROID) 25 MCG tablet, Take 1 tablet (25 mcg total) by mouth daily before breakfast., Disp: 90 tablet, Rfl: 3 .  lisinopril (ZESTRIL) 20 MG tablet, TAKE ONE TABLET BY MOUTH ONCE DAILY., Disp: 90 tablet, Rfl: 0 .  loratadine (CLARITIN) 10 MG tablet, Take 10 mg by mouth daily., Disp: , Rfl:  .  Omega-3 Fatty Acids (OMEGA 3 PO), Take by mouth., Disp: , Rfl:  .  pyridOXINE (VITAMIN B-6) 100 MG tablet, Take 100 mg by mouth daily., Disp: , Rfl:  .  mometasone (ELOCON) 0.1 % cream, Apply 1 application topically daily. Use on hands and behind ears PRN (Patient not taking: Reported on 05/12/2019), Disp: 45 g, Rfl: 2 Medication Side Effects: none  Family Medical/ Social History: Changes? No  MENTAL HEALTH EXAM:  There were no vitals taken for this visit.There is no height or weight on file to calculate BMI.  General Appearance: Casual, Neat and Well Groomed  Eye Contact:  Good  Speech:  Clear and Coherent and Normal Rate  Volume:  Normal  Mood:  Euthymic  Affect:  Appropriate  Thought Process:  Goal Directed and Descriptions of Associations: Intact  Orientation:  Full (Time, Place, and Person)  Thought  Content: Logical   Suicidal Thoughts:  No  Homicidal Thoughts:  No  Memory:  WNL  Judgement:  Good  Insight:  Good  Psychomotor Activity:  Normal  Concentration:  Concentration: Good  Recall:  Good  Fund of Knowledge: Good  Language: Good  Assets:  Desire for Improvement  ADL's:  Intact  Cognition: WNL  Prognosis:  Good    DIAGNOSES:    ICD-10-CM   1. Major depressive disorder, recurrent episode, moderate (HCC)  F33.1   2. Generalized anxiety disorder  F41.1     Receiving  Psychotherapy: No    RECOMMENDATIONS:  PDMP reviewed.  I spent 20 mins w/ her. I am glad to see her doing well!  Because she has done very well on the same dose for quite a while, I think it is okay to go 1 year between visits.  She will call if she has any trouble in the meantime. Continue Prozac 60 mg daily. Continue Valium 10 mg, 1 every 8 hours as needed anxiety. Return in 1 year.   Donnal Moat, PA-C

## 2019-05-25 ENCOUNTER — Encounter: Payer: Self-pay | Admitting: Family Medicine

## 2019-06-23 ENCOUNTER — Other Ambulatory Visit: Payer: Self-pay | Admitting: Physician Assistant

## 2019-06-23 NOTE — Telephone Encounter (Signed)
Patient's next apt is 05/2020 1 year

## 2019-08-11 ENCOUNTER — Other Ambulatory Visit: Payer: Self-pay | Admitting: Family Medicine

## 2019-08-11 MED ORDER — HYDROCHLOROTHIAZIDE 25 MG PO TABS: 25.0000 mg | ORAL_TABLET | Freq: Every day | ORAL | 1 refills | Status: DC

## 2019-08-11 MED ORDER — LISINOPRIL 20 MG PO TABS: 20.0000 mg | ORAL_TABLET | Freq: Every day | ORAL | 1 refills | Status: DC

## 2019-08-28 ENCOUNTER — Other Ambulatory Visit: Payer: Self-pay | Admitting: Physician Assistant

## 2019-08-28 NOTE — Telephone Encounter (Signed)
Next apt 05/2020

## 2019-10-16 ENCOUNTER — Other Ambulatory Visit: Payer: Self-pay | Admitting: Physician Assistant

## 2019-10-16 ENCOUNTER — Other Ambulatory Visit: Payer: Self-pay | Admitting: Family Medicine

## 2019-10-16 NOTE — Telephone Encounter (Signed)
Next apt 05/10/2020

## 2019-10-19 ENCOUNTER — Other Ambulatory Visit: Payer: Self-pay | Admitting: Physician Assistant

## 2019-10-19 NOTE — Telephone Encounter (Signed)
Review.

## 2019-10-23 ENCOUNTER — Other Ambulatory Visit: Payer: Self-pay | Admitting: Nurse Practitioner

## 2019-10-23 ENCOUNTER — Encounter: Payer: Self-pay | Admitting: Family Medicine

## 2019-10-23 ENCOUNTER — Other Ambulatory Visit: Payer: Self-pay | Admitting: Family Medicine

## 2019-10-23 MED ORDER — SULFAMETHOXAZOLE-TRIMETHOPRIM 800-160 MG PO TABS: 1.0000 | ORAL_TABLET | Freq: Two times a day (BID) | ORAL | 0 refills | Status: DC

## 2019-12-02 ENCOUNTER — Other Ambulatory Visit: Payer: Self-pay | Admitting: Physician Assistant

## 2019-12-02 ENCOUNTER — Other Ambulatory Visit: Payer: Self-pay | Admitting: Family Medicine

## 2019-12-02 NOTE — Telephone Encounter (Signed)
Next apt 05/2020

## 2019-12-27 ENCOUNTER — Other Ambulatory Visit: Payer: Self-pay | Admitting: Family Medicine

## 2019-12-28 NOTE — Telephone Encounter (Signed)
Last OV 04-15-19 with Jenne Pane NP Last refill 10-16-19 Ok to refill?

## 2020-01-12 ENCOUNTER — Other Ambulatory Visit: Payer: Self-pay | Admitting: Physician Assistant

## 2020-01-13 NOTE — Telephone Encounter (Signed)
Next apt 05/10/20

## 2020-01-18 ENCOUNTER — Encounter: Payer: Self-pay | Admitting: Nurse Practitioner

## 2020-01-18 ENCOUNTER — Ambulatory Visit (INDEPENDENT_AMBULATORY_CARE_PROVIDER_SITE_OTHER): Payer: BC Managed Care – PPO | Admitting: Nurse Practitioner

## 2020-01-18 ENCOUNTER — Other Ambulatory Visit: Payer: Self-pay

## 2020-01-18 VITALS — BP 136/80 | HR 90 | Temp 97.8°F | Ht 66.0 in | Wt 194.0 lb

## 2020-01-18 DIAGNOSIS — E6609 Other obesity due to excess calories: Secondary | ICD-10-CM

## 2020-01-18 DIAGNOSIS — Z6831 Body mass index (BMI) 31.0-31.9, adult: Secondary | ICD-10-CM

## 2020-01-18 DIAGNOSIS — L309 Dermatitis, unspecified: Secondary | ICD-10-CM

## 2020-01-18 DIAGNOSIS — I10 Essential (primary) hypertension: Secondary | ICD-10-CM

## 2020-01-18 DIAGNOSIS — Z0000 Encounter for general adult medical examination without abnormal findings: Secondary | ICD-10-CM

## 2020-01-18 DIAGNOSIS — E038 Other specified hypothyroidism: Secondary | ICD-10-CM

## 2020-01-18 DIAGNOSIS — F411 Generalized anxiety disorder: Secondary | ICD-10-CM | POA: Diagnosis not present

## 2020-01-18 DIAGNOSIS — E782 Mixed hyperlipidemia: Secondary | ICD-10-CM | POA: Diagnosis not present

## 2020-01-18 DIAGNOSIS — Z124 Encounter for screening for malignant neoplasm of cervix: Secondary | ICD-10-CM

## 2020-01-18 DIAGNOSIS — R300 Dysuria: Secondary | ICD-10-CM | POA: Diagnosis not present

## 2020-01-18 DIAGNOSIS — Z1159 Encounter for screening for other viral diseases: Secondary | ICD-10-CM

## 2020-01-18 DIAGNOSIS — Z0001 Encounter for general adult medical examination with abnormal findings: Secondary | ICD-10-CM

## 2020-01-18 DIAGNOSIS — F324 Major depressive disorder, single episode, in partial remission: Secondary | ICD-10-CM

## 2020-01-18 DIAGNOSIS — N898 Other specified noninflammatory disorders of vagina: Secondary | ICD-10-CM | POA: Diagnosis not present

## 2020-01-18 DIAGNOSIS — Z114 Encounter for screening for human immunodeficiency virus [HIV]: Secondary | ICD-10-CM

## 2020-01-18 DIAGNOSIS — Z1211 Encounter for screening for malignant neoplasm of colon: Secondary | ICD-10-CM

## 2020-01-18 LAB — URINALYSIS, ROUTINE W REFLEX MICROSCOPIC
Bilirubin Urine: NEGATIVE
Glucose, UA: NEGATIVE
Hyaline Cast: NONE SEEN /LPF
Ketones, ur: NEGATIVE
Nitrite: NEGATIVE
Protein, ur: NEGATIVE
Specific Gravity, Urine: 1.01 (ref 1.001–1.03)
pH: 7 (ref 5.0–8.0)

## 2020-01-18 LAB — WET PREP FOR TRICH, YEAST, CLUE

## 2020-01-18 LAB — MICROSCOPIC MESSAGE

## 2020-01-18 MED ORDER — ATORVASTATIN CALCIUM 40 MG PO TABS: 40.0000 mg | ORAL_TABLET | Freq: Every day | ORAL | 1 refills | Status: DC

## 2020-01-18 MED ORDER — LEVOTHYROXINE SODIUM 25 MCG PO TABS
25.0000 ug | ORAL_TABLET | Freq: Every day | ORAL | 1 refills | Status: DC
Start: 2020-01-18 — End: 2020-04-29

## 2020-01-18 MED ORDER — LISINOPRIL 20 MG PO TABS: 20.0000 mg | ORAL_TABLET | Freq: Every day | ORAL | 1 refills | Status: DC

## 2020-01-18 MED ORDER — MOMETASONE FUROATE 0.1 % EX CREA: 1.0000 "application " | TOPICAL_CREAM | Freq: Every day | CUTANEOUS | 2 refills | Status: DC

## 2020-01-18 MED ORDER — HYDROCHLOROTHIAZIDE 25 MG PO TABS
25.0000 mg | ORAL_TABLET | Freq: Every day | ORAL | 1 refills | Status: DC
Start: 2020-01-18 — End: 2020-12-02

## 2020-01-18 NOTE — Assessment & Plan Note (Signed)
Chronic, stable.  Follows with psychiatry-continue this collaboration. 

## 2020-01-18 NOTE — Assessment & Plan Note (Signed)
Chronic, previously with elevated TSH and normal free T4.  We will recheck TSH level today given has consistently been taking levothyroxine 25 mcg for at least 6 months.  Refills sent in.  We will follow up in 6 months.

## 2020-01-18 NOTE — Assessment & Plan Note (Signed)
Continue with dietary lifestyle changes including increasing physical activity with goal of physical activity 30 minutes 5 times weekly.  Decrease caloric intake to help with weight as well.

## 2020-01-18 NOTE — Assessment & Plan Note (Signed)
Chronic, ongoing.  We will recheck lipids today.  Discussed at length risk for having ongoing elevated cholesterol levels and good evidence of statin to help with these and to prevent cardiovascular event risk.  Patient verbalized understanding, will continue on atorvastatin 40 mg daily for now, sent in.  Follow-up in 6 months.

## 2020-01-18 NOTE — Assessment & Plan Note (Signed)
Chronic, stable.  Blood pressure at goal today in clinic.  We will continue hydrochlorothiazide 25 mg and lisinopril 20 mg daily.  Refill sent in for 6 months.  Follow-up in 6 months or sooner if blood pressure running greater than 140/90 at home.

## 2020-01-18 NOTE — Addendum Note (Signed)
Addended by: Cathlean Marseilles A on: 01/18/2020 04:51 PM   Modules accepted: Orders

## 2020-01-18 NOTE — Patient Instructions (Signed)

## 2020-01-18 NOTE — Progress Notes (Signed)
BP 136/80   Pulse 90   Temp 97.8 F (36.6 C)   Ht _0  (1.676 m)   Wt 194 lb (88 kg)   SpO2 98%   BMI 31.31 kg/m    Subjective:    Patient ID: Elizabeth Cobb, female    DOB: 1960-05-11, 59 y.o.   MRN: [id number]  HPI: Elizabeth Cobb is a 59 y.o. female presenting on 01/18/2020 for comprehensive medical examination. Current medical complaints include:  VAGINAL DISCHARGE Duration: weeks - 6 Discharge description: white, clear  Pruritus: no Dysuria: yes Malodorous: no Urinary frequency: no Fevers: no Abdominal pain: no  Sexual activity: not sexually active History of sexually transmitted diseases: yes; took Chlamydia Recent antibiotic use: no Context: worse  Treatments attempted: boric acid suppositories, creams, etc.  Reports is currently taking hydrochlorothiazide and lisinopril and tolerating these medications well for her blood pressure.  Has not been taking atorvastatin-did not think that she needed it and is focusing on diet and lifestyle changes instead.  Is also taking levothyroxine 25 mcg daily without any issues.  She currently lives with: husband Menopause: postmenopausal  Depression screen [facility] 2/9 01/18/2020 10/28/2017 04/18/2017  Decreased Interest 0 0 3  Down, Depressed, Hopeless 0 0 3  PHQ - 2 Score 0 0 6  Altered sleeping - 0 0  Tired, decreased energy - 0 3  Change in appetite - 0 2  Feeling bad or failure about yourself  - 0 1  Trouble concentrating - 0 1  Moving slowly or fidgety/restless - 0 2  Suicidal thoughts - 0 0  PHQ-9 Score - 0 15  Difficult doing work/chores - Not difficult at all Somewhat difficult   The patient does not have a history of falls. I did not complete a risk assessment for falls. A plan of care for falls was not documented.   Past Medical History:  Past Medical History:  Diagnosis Date  . Screening for hyperlipidemia 05/02/2018    Surgical History:  No past surgical history on file.  Medications:  Current  Outpatient Medications on File Prior to Visit  Medication Sig  . b complex vitamins tablet Take 1 tablet by mouth daily.  . Cholecalciferol (VITAMIN D3) 2000 units TABS Take by mouth.  . diazepam (VALIUM) 10 MG tablet TAKE 1 TABLET EVERY 8 HOURS AS NEEDED.  Marland Kitchen FLUoxetine (PROZAC) 20 MG capsule TAKE 3 CAPSULES BY MOUTH ONCE DAILY.  . fluticasone (FLONASE) 50 MCG/ACT nasal spray INHALE 2 SPRAYS IN EACH NOSTRIL ONCE DAILY.  Marland Kitchen Omega-3 Fatty Acids (OMEGA 3 PO) Take by mouth.  . pyridOXINE (VITAMIN B-6) 100 MG tablet Take 100 mg by mouth daily.  Marland Kitchen sulfamethoxazole-trimethoprim (BACTRIM DS) 800-160 MG tablet Take 1 tablet by mouth 2 (two) times daily.  . chlorpheniramine (CHLOR-TRIMETON) 4 MG tablet Take 4 mg by mouth 2 (two) times daily as needed for allergies.   No current facility-administered medications on file prior to visit.    Allergies:  No Known Allergies  Social History:  Social History   Socioeconomic History  . Marital status: Married    Spouse name: Not on file  . Number of children: Not on file  . Years of education: Not on file  . Highest education level: Not on file  Occupational History  . Not on file  Tobacco Use  . Smoking status: Current Every Day Smoker    Types: E-cigarettes  . Smokeless tobacco: Never Used  Substance and Sexual Activity  . Alcohol use: Yes  Alcohol/week: 1.0 standard drink    Types: 1 Cans of beer per week    Comment: per month.  . Drug use: Never  . Sexual activity: Not on file  Other Topics Concern  . Not on file  Social History Narrative  . Not on file   Social Determinants of Health   Financial Resource Strain: Not on file  Food Insecurity: Not on file  Transportation Needs: Not on file  Physical Activity: Not on file  Stress: Not on file  Social Connections: Not on file  Intimate Partner Violence: Not on file   Social History   Tobacco Use  Smoking Status Current Every Day Smoker  . Types: E-cigarettes  Smokeless  Tobacco Never Used   Social History   Substance and Sexual Activity  Alcohol Use Yes  . Alcohol/week: 1.0 standard drink  . Types: 1 Cans of beer per week   Comment: per month.    Family History:  Family History  Problem Relation Age of Onset  . Cancer Mother        Some type of Leukemia  . Lung cancer Father   . Bipolar disorder Sister     Past medical history, surgical history, medications, allergies, family history and social history reviewed with patient today and changes made to appropriate areas of the chart.   Review of Systems  Constitutional: Negative.   HENT: Negative.   Eyes: Negative.   Respiratory: Negative.   Cardiovascular: Negative.   Gastrointestinal: Negative.   Genitourinary: Negative for dysuria, hematuria and urgency.       + vaginal discharge  Musculoskeletal: Negative.   Skin: Negative.   Neurological: Negative.   Psychiatric/Behavioral: Negative.        Objective:    BP 136/80   Pulse 90   Temp 97.8 F (36.6 C)   Ht _0  (1.676 m)   Wt 194 lb (88 kg)   SpO2 98%   BMI 31.31 kg/m   Wt Readings from Last 3 Encounters:  01/18/20 194 lb (88 kg)  04/15/19 204 lb 12.8 oz (92.9 kg)  01/05/19 202 lb (91.6 kg)    Physical Exam Vitals and nursing note reviewed. Exam conducted with a chaperone present.  Constitutional:      General: She is not in acute distress.    Appearance: Normal appearance. She is obese. She is not toxic-appearing.  HENT:     Head: Normocephalic and atraumatic.     Right Ear: Tympanic membrane, ear canal and external ear normal. There is no impacted cerumen.     Left Ear: Tympanic membrane, ear canal and external ear normal. There is no impacted cerumen.     Nose: Nose normal. No congestion or rhinorrhea.     Mouth/Throat:     Mouth: Mucous membranes are moist.     Pharynx: Oropharynx is clear. No oropharyngeal exudate or posterior oropharyngeal erythema.  Eyes:     General: No scleral icterus.       Right eye: No  discharge.        Left eye: No discharge.     Extraocular Movements: Extraocular movements intact.     Pupils: Pupils are equal, round, and reactive to light.  Neck:     Vascular: No carotid bruit.  Cardiovascular:     Rate and Rhythm: Normal rate and regular rhythm.     Heart sounds: Normal heart sounds. No murmur heard.   Pulmonary:     Effort: Pulmonary effort is normal. No respiratory distress.  Breath sounds: Normal breath sounds. No wheezing, rhonchi or rales.  Chest:     Chest wall: No mass.  Breasts:     Right: Normal. No inverted nipple, nipple discharge, skin change, tenderness, axillary adenopathy or supraclavicular adenopathy.     Left: Normal. No nipple discharge, tenderness, axillary adenopathy or supraclavicular adenopathy.    Abdominal:     General: Abdomen is flat. Bowel sounds are normal. There is no distension.     Palpations: Abdomen is soft. There is no mass.     Tenderness: There is no abdominal tenderness.  Genitourinary:    General: Normal vulva.     Exam position: Lithotomy position.     Pubic Area: No rash.      Labia:        Right: No rash, tenderness or lesion.        Left: No rash, tenderness or lesion.      Urethra: No prolapse.     Vagina: Normal. No vaginal discharge, erythema or lesions.     Cervix: Normal.     Uterus: Normal.      Adnexa: Right adnexa normal and left adnexa normal.  Musculoskeletal:        General: Normal range of motion.     Cervical back: Normal range of motion and neck supple.     Right lower leg: No edema.     Left lower leg: No edema.  Lymphadenopathy:     Cervical: No cervical adenopathy.     Upper Body:     Right upper body: No supraclavicular, axillary or pectoral adenopathy.     Left upper body: No supraclavicular, axillary or pectoral adenopathy.     Lower Body: No right inguinal adenopathy. No left inguinal adenopathy.  Skin:    General: Skin is warm and dry.     Capillary Refill: Capillary refill takes  less than 2 seconds.     Coloration: Skin is not jaundiced or pale.     Findings: No erythema.  Neurological:     General: No focal deficit present.     Mental Status: She is alert and oriented to person, place, and time.     Motor: No weakness.     Gait: Gait normal.  Psychiatric:        Mood and Affect: Mood normal.        Behavior: Behavior normal.        Thought Content: Thought content normal.        Judgment: Judgment normal.        Assessment & Plan:   Problem List Items Addressed This Visit      Cardiovascular and Mediastinum   Essential hypertension    Chronic, stable.  Blood pressure at goal today in clinic.  We will continue hydrochlorothiazide 25 mg and lisinopril 20 mg daily.  Refill sent in for 6 months.  Follow-up in 6 months or sooner if blood pressure running greater than 140/90 at home.      Relevant Medications   hydrochlorothiazide (HYDRODIURIL) 25 MG tablet   lisinopril (ZESTRIL) 20 MG tablet   atorvastatin (LIPITOR) 40 MG tablet   Other Relevant Orders   CMP14+EGFR   COMPLETE METABOLIC PANEL WITH GFR     Endocrine   Subclinical hypothyroidism    Chronic, previously with elevated TSH and normal free T4.  We will recheck TSH level today given has consistently been taking levothyroxine 25 mcg for at least 6 months.  Refills sent in.  We will follow  up in 6 months.      Relevant Medications   levothyroxine (SYNTHROID) 25 MCG tablet   Other Relevant Orders   TSH     Other   MDD (major depressive disorder)    Chronic, stable.  Follows with psychiatry-continue this collaboration.      GAD (generalized anxiety disorder)    Chronic, stable.  Follows with psychiatry-continue this collaboration.      Relevant Orders   CBC with Differential   Class 1 obesity with body mass index (BMI) of 31.0 to 31.9 in adult    Continue with dietary lifestyle changes including increasing physical activity with goal of physical activity 30 minutes 5 times weekly.   Decrease caloric intake to help with weight as well.      Relevant Orders   Lipid panel   CMP14+EGFR   Hemoglobin A1c   Mixed hyperlipidemia    Chronic, ongoing.  We will recheck lipids today.  Discussed at length risk for having ongoing elevated cholesterol levels and good evidence of statin to help with these and to prevent cardiovascular event risk.  Patient verbalized understanding, will continue on atorvastatin 40 mg daily for now, sent in.  Follow-up in 6 months.      Relevant Medications   hydrochlorothiazide (HYDRODIURIL) 25 MG tablet   lisinopril (ZESTRIL) 20 MG tablet   atorvastatin (LIPITOR) 40 MG tablet   Other Relevant Orders   Lipid panel   COMPLETE METABOLIC PANEL WITH GFR    Other Visit Diagnoses    Annual physical exam    -  Primary   Relevant Orders   Lipid panel   CMP14+EGFR   TSH   CBC with Differential   COMPLETE METABOLIC PANEL WITH GFR   Vaginal discharge       Relevant Orders   WET PREP FOR [place], YEAST, CLUE (Completed)   POCT urinalysis dipstick   Encounter for screening for HIV       Relevant Orders   HIV Antibody (routine testing w rflx)   Need for hepatitis C screening test       Relevant Orders   Hepatitis C antibody   Cervical cancer screening       Relevant Orders   PAP,TP IMGw/HPV RNA,rflx [id number]/37   Screening for colon cancer       Relevant Orders   Cologuard   Eczema, unspecified type       Relevant Medications   mometasone (ELOCON) 0.1 % cream       Follow up plan: Return in about 6 months (around 07/18/2020) for 6 month f/u; labs 1 week before.   LABORATORY TESTING:  - Pap smear: pap done  IMMUNIZATIONS:   - Tdap: Tetanus vaccination status reviewed: last tetanus booster within 10 years. - Influenza: Refused - Pneumovax: Refused - Prevnar: Refused - HPV: Not applicable - Shingrix vaccine: Not applicable - [id number] vaccine: Refused  SCREENING: -Mammogram: Up to date  - Colonoscopy: Ordered today - Cologuard.   Discussed risk vs. Benefits of each. - Bone Density: Not applicable  -Hearing Test: Not applicable  -Spirometry: Not applicable   PATIENT COUNSELING:   Advised to take 1 mg of folate supplement per day if capable of pregnancy.   Sexuality: Discussed sexually transmitted diseases, partner selection, use of condoms, avoidance of unintended pregnancy  and contraceptive alternatives.   Advised to avoid cigarette smoking.  I discussed with the patient that most people either abstain from alcohol or drink within safe limits (<=14/week and <=4 drinks/occasion for males, <=  7/weeks and <= 3 drinks/occasion for females) and that the risk for alcohol disorders and other health effects rises proportionally with the number of drinks per week and how often a drinker exceeds daily limits.  Discussed cessation/primary prevention of drug use and availability of treatment for abuse.   Diet: Encouraged to adjust caloric intake to maintain  or achieve ideal body weight, to reduce intake of dietary saturated fat and total fat, to limit sodium intake by avoiding high sodium foods and not adding table salt, and to maintain adequate dietary potassium and calcium preferably from fresh fruits, vegetables, and low-fat dairy products.    stressed the importance of regular exercise  Injury prevention: Discussed safety belts, safety helmets, smoke detector, smoking near bedding or upholstery.   Dental health: Discussed importance of regular tooth brushing, flossing, and dental visits.    NEXT PREVENTATIVE PHYSICAL DUE IN 1 YEAR. Return in about 6 months (around 07/18/2020) for 6 month f/u; labs 1 week before.

## 2020-01-18 NOTE — Assessment & Plan Note (Signed)
Chronic, stable.  Follows with psychiatry-continue this collaboration.

## 2020-01-19 LAB — CBC WITH DIFFERENTIAL/PLATELET
Absolute Monocytes: 404 cells/uL (ref 200–950)
Basophils Absolute: 42 cells/uL (ref 0–200)
Basophils Relative: 0.9 %
Eosinophils Absolute: 61 cells/uL (ref 15–500)
Eosinophils Relative: 1.3 %
HCT: 38 % (ref 35.0–45.0)
Hemoglobin: 13.1 g/dL (ref 11.7–15.5)
Lymphs Abs: 1438 cells/uL (ref 850–3900)
MCH: 31.6 pg (ref 27.0–33.0)
MCHC: 34.5 g/dL (ref 32.0–36.0)
MCV: 91.6 fL (ref 80.0–100.0)
MPV: 12.8 fL — ABNORMAL HIGH (ref 7.5–12.5)
Monocytes Relative: 8.6 %
Neutro Abs: 2754 cells/uL (ref 1500–7800)
Neutrophils Relative %: 58.6 %
Platelets: 149 10*3/uL (ref 140–400)
RBC: 4.15 10*6/uL (ref 3.80–5.10)
RDW: 11.8 % (ref 11.0–15.0)
Total Lymphocyte: 30.6 %
WBC: 4.7 10*3/uL (ref 3.8–10.8)

## 2020-01-19 LAB — HEPATITIS C ANTIBODY
Hepatitis C Ab: NONREACTIVE
SIGNAL TO CUT-OFF: 0.01 (ref <1.00)

## 2020-01-19 LAB — HEMOGLOBIN A1C
Hgb A1c MFr Bld: 4 % of total Hgb (ref <5.7)
Mean Plasma Glucose: 68 mg/dL
eAG (mmol/L): 3.8 mmol/L

## 2020-01-19 LAB — COMPLETE METABOLIC PANEL WITH GFR
AG Ratio: 1.9 (calc) (ref 1.0–2.5)
ALT: 19 U/L (ref 6–29)
AST: 24 U/L (ref 10–35)
Albumin: 4.6 g/dL (ref 3.6–5.1)
Alkaline phosphatase (APISO): 53 U/L (ref 37–153)
BUN: 12 mg/dL (ref 7–25)
CO2: 30 mmol/L (ref 20–32)
Calcium: 9.6 mg/dL (ref 8.6–10.4)
Chloride: 96 mmol/L — ABNORMAL LOW (ref 98–110)
Creat: 0.75 mg/dL (ref 0.50–0.99)
GFR, Est African American: 100 mL/min/{1.73_m2} (ref >=60)
GFR, Est Non African American: 86 mL/min/{1.73_m2} (ref >=60)
Globulin: 2.4 g/dL (calc) (ref 1.9–3.7)
Glucose, Bld: 90 mg/dL (ref 65–99)
Potassium: 4 mmol/L (ref 3.5–5.3)
Sodium: 135 mmol/L (ref 135–146)
Total Bilirubin: 0.6 mg/dL (ref 0.2–1.2)
Total Protein: 7 g/dL (ref 6.1–8.1)

## 2020-01-19 LAB — LIPID PANEL
Cholesterol: 253 mg/dL — ABNORMAL HIGH (ref <200)
HDL: 44 mg/dL — ABNORMAL LOW (ref >=50)
LDL Cholesterol (Calc): 171 mg/dL (calc) — ABNORMAL HIGH
Non-HDL Cholesterol (Calc): 209 mg/dL (calc) — ABNORMAL HIGH (ref <130)
Total CHOL/HDL Ratio: 5.8 (calc) — ABNORMAL HIGH (ref <5.0)
Triglycerides: 215 mg/dL — ABNORMAL HIGH (ref <150)

## 2020-01-19 LAB — URINE CULTURE
MICRO NUMBER:: 11309845
SPECIMEN QUALITY:: ADEQUATE

## 2020-01-19 LAB — HIV ANTIBODY (ROUTINE TESTING W REFLEX): HIV 1&2 Ab, 4th Generation: NONREACTIVE

## 2020-01-19 LAB — TSH: TSH: 4.36 mIU/L (ref 0.40–4.50)

## 2020-01-20 ENCOUNTER — Encounter: Payer: Self-pay | Admitting: Nurse Practitioner

## 2020-01-20 DIAGNOSIS — N905 Atrophy of vulva: Secondary | ICD-10-CM

## 2020-01-20 LAB — PAP, TP IMAGING W/ HPV RNA, RFLX HPV TYPE 16,18/45: HPV DNA High Risk: NOT DETECTED

## 2020-01-20 MED ORDER — ESTRADIOL 0.1 MG/GM VA CREA: 1.0000 g | TOPICAL_CREAM | Freq: Every day | VAGINAL | 3 refills | Status: DC

## 2020-01-25 ENCOUNTER — Telehealth: Payer: Self-pay | Admitting: *Deleted

## 2020-01-25 NOTE — Telephone Encounter (Signed)
Received verbal orders for Cologuard.   Order placed via [employer].   Cologuard (Order [id number])

## 2020-02-29 ENCOUNTER — Other Ambulatory Visit: Payer: Self-pay | Admitting: Physician Assistant

## 2020-03-15 IMAGING — MG DIGITAL SCREENING BILATERAL MAMMOGRAM WITH TOMO AND CAD
8 series · 8 of 24 positions shown · non-contrast
Comparison: None.

CLINICAL DATA: Screening.

EXAM:
DIGITAL SCREENING BILATERAL MAMMOGRAM WITH TOMO AND CAD

[L CC synth-2D]
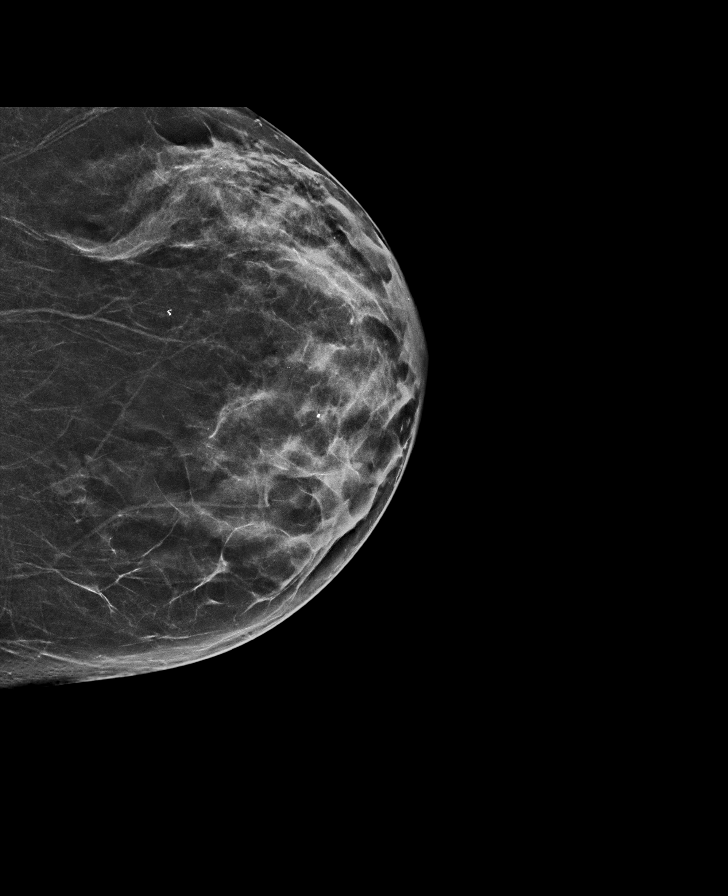

[L MLO synth-2D]
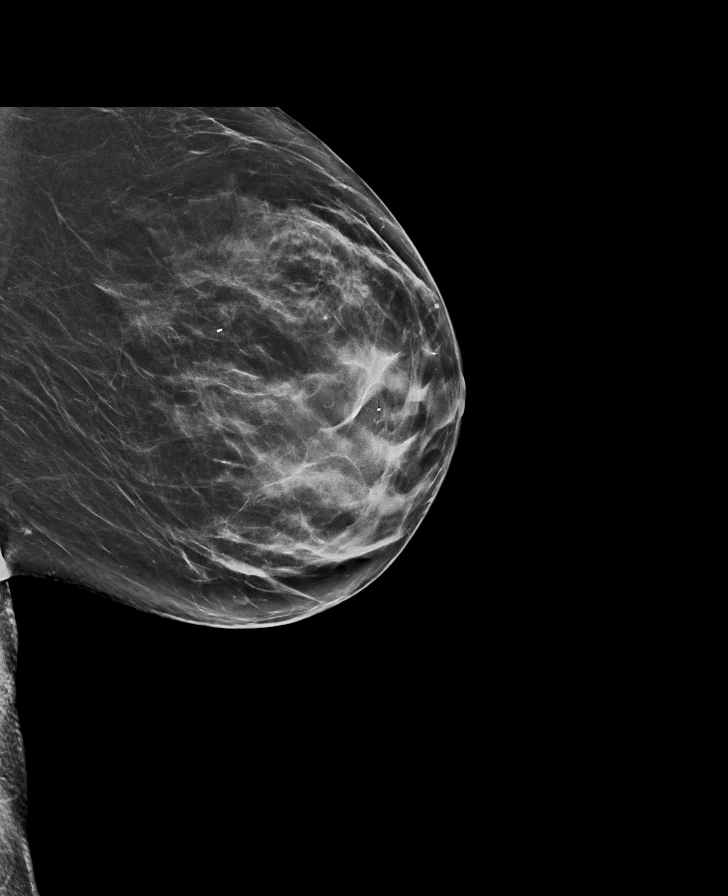

[R MLO synth-2D]
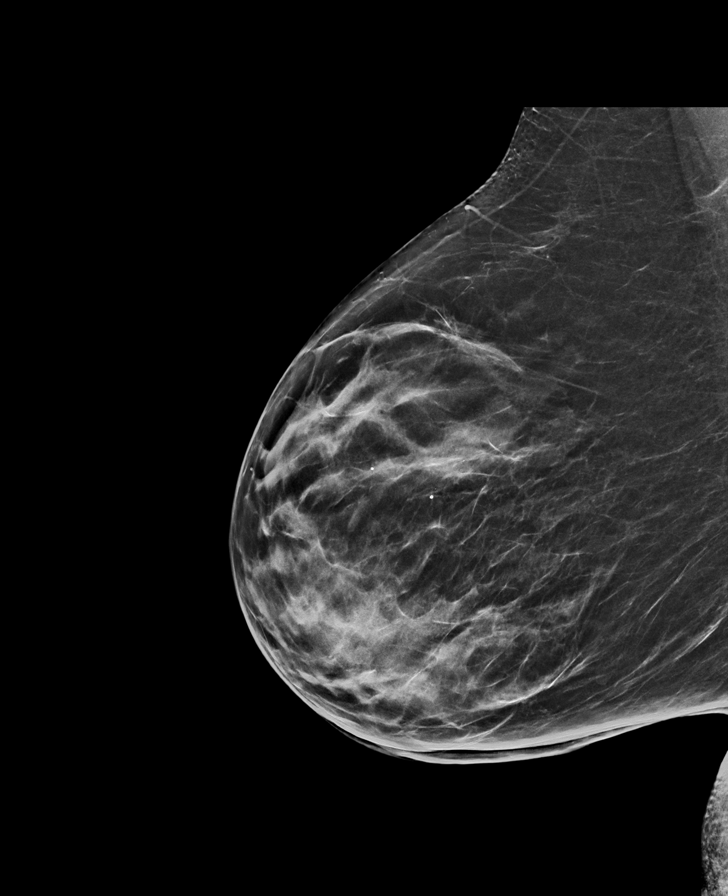

[R CC synth-2D]
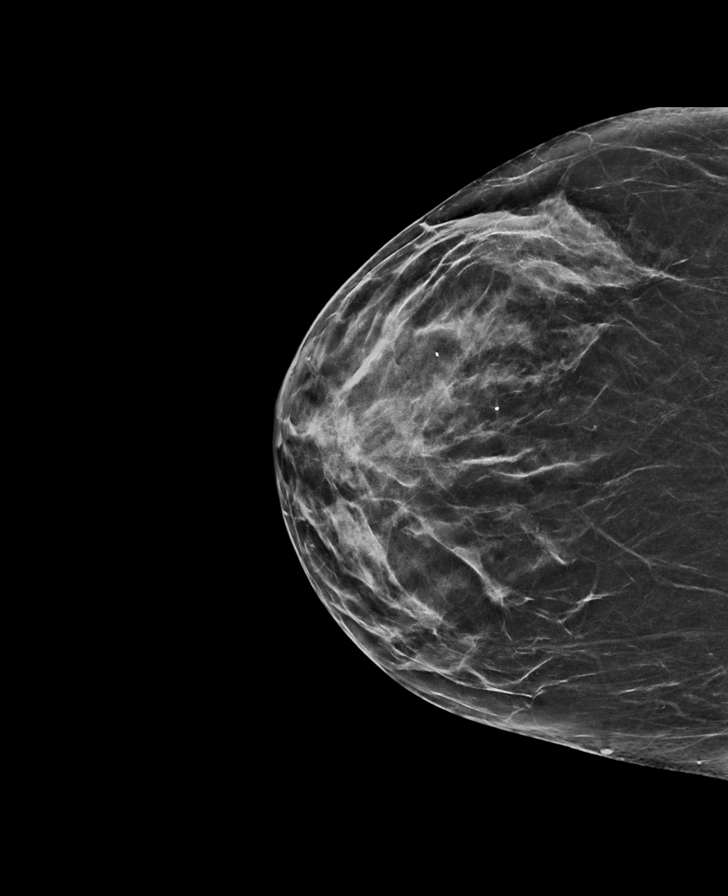

[R MLO tomo · tomo slice 33/65.0]
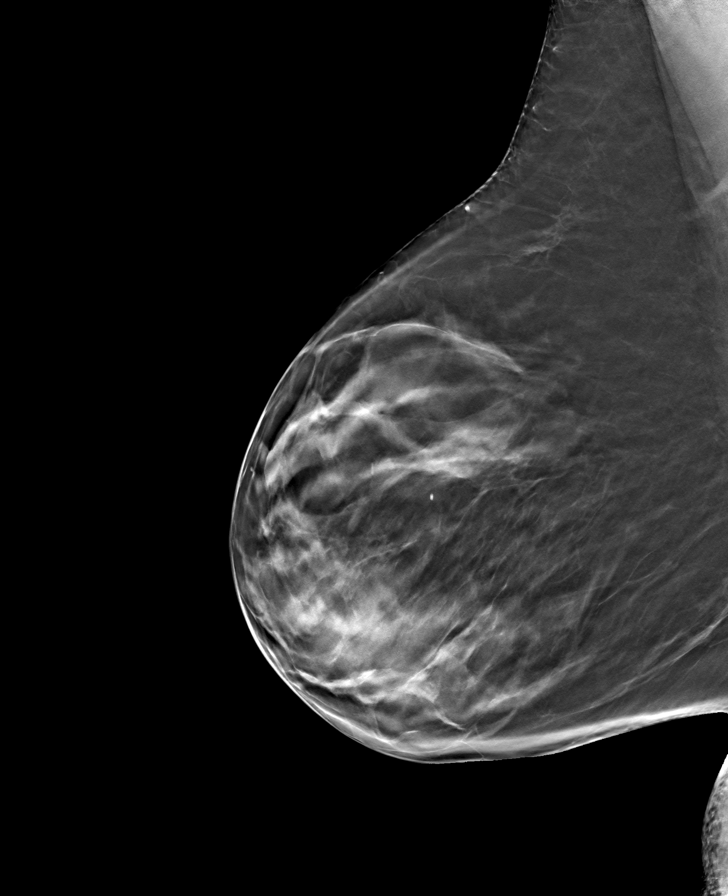

[L CC tomo · tomo slice 31/62.0]
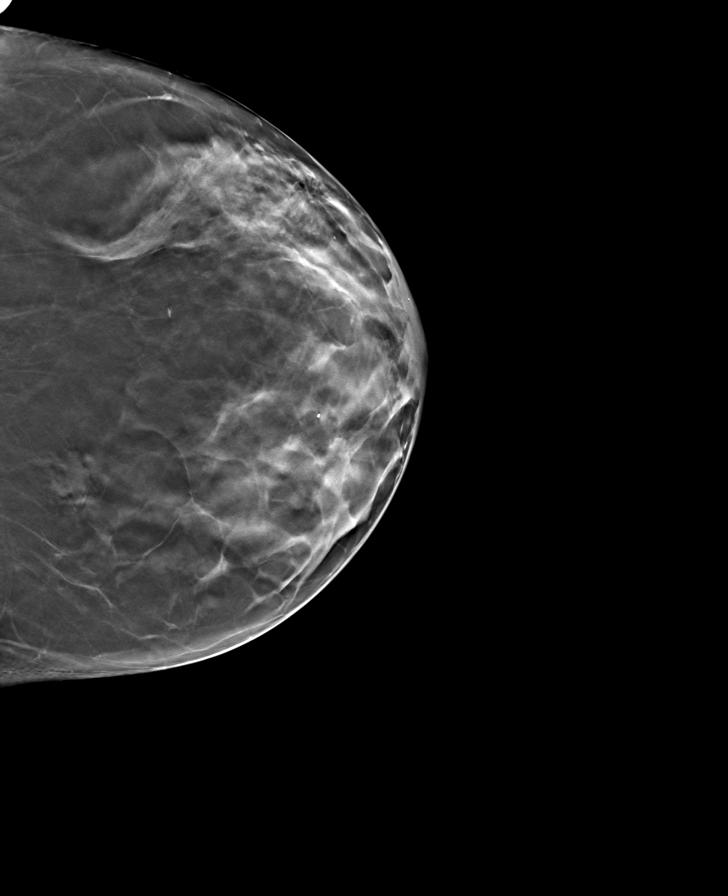

[L MLO tomo · tomo slice 35/69.0]
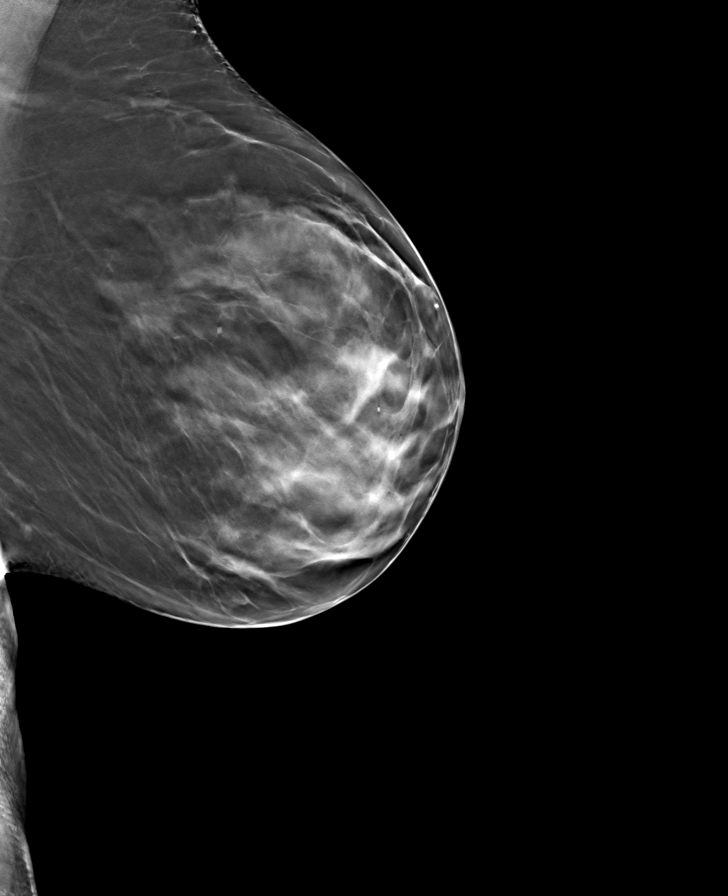

[R CC tomo · tomo slice 31/60.0]
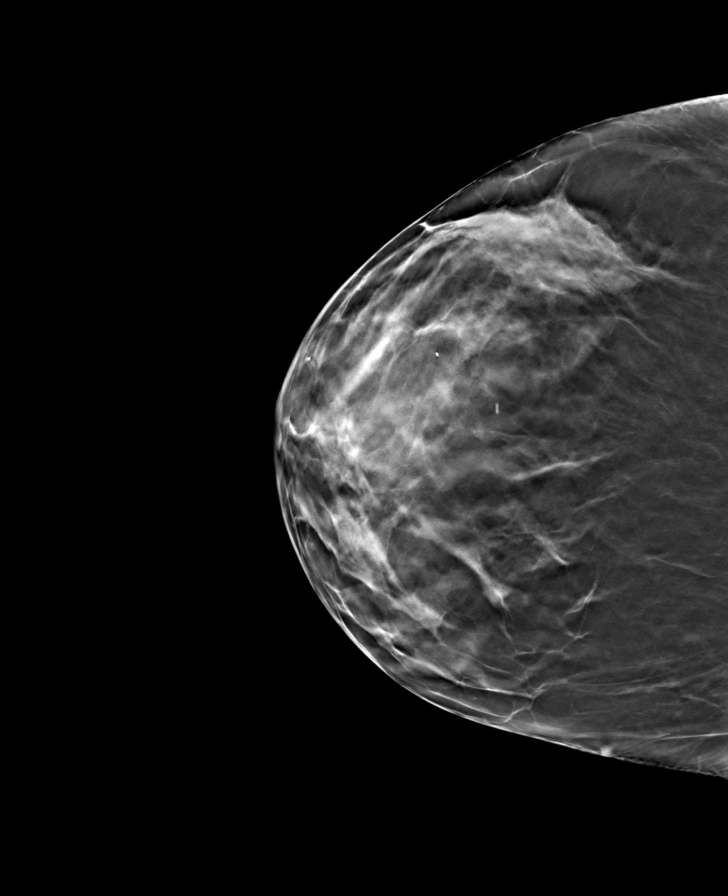

[8 of 24 positions shown; findings below may reference images not displayed]

ACR Breast Density Category c: The breast tissue is heterogeneously
dense, which may obscure small masses
FINDINGS: There are no findings suspicious for malignancy. Images were
processed with CAD.
IMPRESSION: No mammographic evidence of malignancy. A result letter of this
screening mammogram will be mailed directly to the patient.

RECOMMENDATION:
Screening mammogram in one year. (Code:EM-2-IHY)

BI-RADS CATEGORY  1: Negative.

## 2020-04-29 ENCOUNTER — Other Ambulatory Visit: Payer: Self-pay

## 2020-04-29 DIAGNOSIS — E038 Other specified hypothyroidism: Secondary | ICD-10-CM

## 2020-04-29 MED ORDER — LEVOTHYROXINE SODIUM 25 MCG PO TABS: 25.0000 ug | ORAL_TABLET | Freq: Every day | ORAL | 1 refills | Status: DC

## 2020-05-10 ENCOUNTER — Encounter: Payer: Self-pay | Admitting: Physician Assistant

## 2020-05-10 ENCOUNTER — Other Ambulatory Visit: Payer: Self-pay

## 2020-05-10 ENCOUNTER — Ambulatory Visit (INDEPENDENT_AMBULATORY_CARE_PROVIDER_SITE_OTHER): Payer: BC Managed Care – PPO | Admitting: Physician Assistant

## 2020-05-10 DIAGNOSIS — F411 Generalized anxiety disorder: Secondary | ICD-10-CM | POA: Diagnosis not present

## 2020-05-10 DIAGNOSIS — F3342 Major depressive disorder, recurrent, in full remission: Secondary | ICD-10-CM

## 2020-05-10 MED ORDER — FLUOXETINE HCL 20 MG PO CAPS: 60.0000 mg | ORAL_CAPSULE | Freq: Every day | ORAL | 3 refills | Status: DC

## 2020-05-10 MED ORDER — ALPRAZOLAM 0.5 MG PO TABS: 0.2500 mg | ORAL_TABLET | Freq: Two times a day (BID) | ORAL | 0 refills | Status: DC | PRN

## 2020-05-10 NOTE — Progress Notes (Signed)
[facility]  Patient ID: Elizabeth Cobb,  MRN: [id number]  PCP: Donita Brooks, MD  Date of Evaluation: 05/10/2020 Time spent:20 minutes  Chief Complaint:  Chief Complaint    Anxiety; Depression; Follow-up      HISTORY/CURRENT STATUS: HPI For annual med check.  Patient denies loss of interest in usual activities and is able to enjoy things.  Denies decreased energy or motivation.  Appetite has not changed.  No extreme sadness, tearfulness, or feelings of hopelessness.  Denies any changes in concentration, making decisions or remembering things.  Denies suicidal or homicidal thoughts.  Patient denies increased energy with decreased need for sleep, no increased talkativeness, no racing thoughts, no impulsivity or risky behaviors, no increased spending, no increased libido, no grandiosity, no increased irritability or anger, and no hallucinations.  Valium isn't working well at all. Has trouble relaxing, racing thoughts especially in the evening.  This is been going on for quite a while.  Has taken Xanax in the past and would like to retry it.  Individual Medical History/ Review of Systems: Changes? :No    Past medications for mental health diagnoses include: Chantix, Prozac, Xanax, Remeron, Klonopin, Wellbutrin caused increased agitation.   Allergies: Patient has no known allergies.  Current Medications:  Current Outpatient Medications:  .  ALPRAZolam (XANAX) 0.5 MG tablet, Take 0.5-1 tablets (0.25-0.5 mg total) by mouth 2 (two) times daily as needed for anxiety., Disp: 60 tablet, Rfl: 0 .  atorvastatin (LIPITOR) 40 MG tablet, Take 1 tablet (40 mg total) by mouth daily., Disp: 90 tablet, Rfl: 1 .  hydrochlorothiazide (HYDRODIURIL) 25 MG tablet, Take 1 tablet (25 mg total) by mouth daily., Disp: 90 tablet, Rfl: 1 .  levothyroxine (SYNTHROID) 25 MCG tablet, Take 1 tablet (25 mcg total) by mouth daily before breakfast., Disp: 90 tablet, Rfl: 1 .  lisinopril (ZESTRIL) 20  MG tablet, Take 1 tablet (20 mg total) by mouth daily., Disp: 90 tablet, Rfl: 1 .  mometasone (ELOCON) 0.1 % cream, Apply 1 application topically daily. Use on hands and behind ears PRN, Disp: 45 g, Rfl: 2 .  Omega-3 Fatty Acids (OMEGA 3 PO), Take by mouth., Disp: , Rfl:  .  b complex vitamins tablet, Take 1 tablet by mouth daily., Disp: , Rfl:  .  chlorpheniramine (CHLOR-TRIMETON) 4 MG tablet, Take 4 mg by mouth 2 (two) times daily as needed for allergies., Disp: , Rfl:  .  Cholecalciferol (VITAMIN D3) 2000 units TABS, Take by mouth., Disp: , Rfl:  .  estradiol (ESTRACE) 0.1 MG/GM vaginal cream, Place 1 g vaginally at bedtime. Use 1 gram every night for 2 weeks.  Then, use 500 mg one to three times per week., Disp: 42.5 g, Rfl: 3 .  FLUoxetine (PROZAC) 20 MG capsule, Take 3 capsules (60 mg total) by mouth daily., Disp: 270 capsule, Rfl: 3 .  fluticasone (FLONASE) 50 MCG/ACT nasal spray, INHALE 2 SPRAYS IN EACH NOSTRIL ONCE DAILY., Disp: 16 g, Rfl: 3 .  pyridOXINE (VITAMIN B-6) 100 MG tablet, Take 100 mg by mouth daily., Disp: , Rfl:  .  sulfamethoxazole-trimethoprim (BACTRIM DS) 800-160 MG tablet, Take 1 tablet by mouth 2 (two) times daily., Disp: 10 tablet, Rfl: 0 Medication Side Effects: none  Family Medical/ Social History: Changes?  Retired since her last visit.  MENTAL HEALTH EXAM:  There were no vitals taken for this visit.There is no height or weight on file to calculate BMI.  General Appearance: Casual, Neat and Well Groomed  Eye Contact:  Good  Speech:  Clear and Coherent and Normal Rate  Volume:  Normal  Mood:  Euthymic  Affect:  Appropriate  Thought Process:  Goal Directed and Descriptions of Associations: Intact  Orientation:  Full (Time, Place, and Person)  Thought Content: Logical   Suicidal Thoughts:  No  Homicidal Thoughts:  No  Memory:  WNL  Judgement:  Good  Insight:  Good  Psychomotor Activity:  Normal  Concentration:  Concentration: Good  Recall:  Good  Fund of  Knowledge: Good  Language: Good  Assets:  Desire for Improvement  ADL's:  Intact  Cognition: WNL  Prognosis:  Good    DIAGNOSES:    ICD-10-CM   1. Generalized anxiety disorder  F41.1   2. Recurrent major depressive disorder, in full remission (HCC)  F33.42     Receiving Psychotherapy: No    RECOMMENDATIONS:  PDMP was reviewed. I provided 20 minutes of face-to-face time during this encounter including time spent reviewing records and charting. She is doing well so no changes will be made with the Prozac.  However Valium is not working well so we will change to Xanax.  She does not remember the dose she took in the past and I do not see any records on that.  Will start at 0.5 mg and increase if needed.  When she is on her last week of the Xanax, call and leave a message as to how it is working.  If that dose is working fine then I will send in a prescription with 5 refills.  If not I will send in a 1 month supply of whichever dose we need and go from there. Discontinue Valium. Start Xanax 0.5 mg, 1/2-1 p.o. twice daily as needed. Continue Prozac 20 mg, 3 p.o. daily. Return in 1 year.  Melony Overly, PA-C

## 2020-06-16 ENCOUNTER — Telehealth: Payer: Self-pay | Admitting: Physician Assistant

## 2020-06-16 ENCOUNTER — Other Ambulatory Visit: Payer: Self-pay

## 2020-06-16 MED ORDER — ALPRAZOLAM 0.5 MG PO TABS: 0.2500 mg | ORAL_TABLET | Freq: Two times a day (BID) | ORAL | 0 refills | Status: DC | PRN

## 2020-06-16 NOTE — Telephone Encounter (Signed)
Next visit is 05/10/20. Requesting refill on Xanax called to:  [id number] - [place], [place] - [address] Phone:  [phone]  Fax:  [phone]

## 2020-06-16 NOTE — Telephone Encounter (Signed)
Last filled 05/10/20 and seen on that day.pended

## 2020-06-20 NOTE — Telephone Encounter (Signed)
Dr. Jennelle Human sent Rx in on 06/16/2020

## 2020-07-18 ENCOUNTER — Ambulatory Visit: Payer: BC Managed Care – PPO | Admitting: Family Medicine

## 2020-07-29 ENCOUNTER — Other Ambulatory Visit: Payer: Self-pay | Admitting: Psychiatry

## 2020-07-29 NOTE — Telephone Encounter (Signed)
Pt returns yearly

## 2020-09-08 ENCOUNTER — Other Ambulatory Visit: Payer: Self-pay | Admitting: Nurse Practitioner

## 2020-09-08 DIAGNOSIS — I10 Essential (primary) hypertension: Secondary | ICD-10-CM

## 2020-09-24 ENCOUNTER — Other Ambulatory Visit: Payer: Self-pay | Admitting: Psychiatry

## 2020-09-26 NOTE — Telephone Encounter (Signed)
Last filled 6/24 f/u due next year

## 2020-12-02 ENCOUNTER — Other Ambulatory Visit: Payer: Self-pay | Admitting: Family Medicine

## 2020-12-02 DIAGNOSIS — I10 Essential (primary) hypertension: Secondary | ICD-10-CM

## 2021-01-17 ENCOUNTER — Other Ambulatory Visit: Payer: BC Managed Care – PPO

## 2021-01-17 ENCOUNTER — Other Ambulatory Visit: Payer: Self-pay

## 2021-01-17 DIAGNOSIS — I10 Essential (primary) hypertension: Secondary | ICD-10-CM

## 2021-01-17 DIAGNOSIS — Z0000 Encounter for general adult medical examination without abnormal findings: Secondary | ICD-10-CM | POA: Diagnosis not present

## 2021-01-17 DIAGNOSIS — E782 Mixed hyperlipidemia: Secondary | ICD-10-CM

## 2021-01-17 DIAGNOSIS — E6609 Other obesity due to excess calories: Secondary | ICD-10-CM

## 2021-01-17 DIAGNOSIS — E038 Other specified hypothyroidism: Secondary | ICD-10-CM

## 2021-01-18 LAB — COMPREHENSIVE METABOLIC PANEL
AG Ratio: 1.8 (calc) (ref 1.0–2.5)
ALT: 19 U/L (ref 6–29)
AST: 20 U/L (ref 10–35)
Albumin: 4.3 g/dL (ref 3.6–5.1)
Alkaline phosphatase (APISO): 52 U/L (ref 37–153)
BUN: 24 mg/dL (ref 7–25)
CO2: 28 mmol/L (ref 20–32)
Calcium: 9.2 mg/dL (ref 8.6–10.4)
Chloride: 100 mmol/L (ref 98–110)
Creat: 0.67 mg/dL (ref 0.50–1.05)
Globulin: 2.4 g/dL (calc) (ref 1.9–3.7)
Glucose, Bld: 108 mg/dL — ABNORMAL HIGH (ref 65–99)
Potassium: 4.3 mmol/L (ref 3.5–5.3)
Sodium: 137 mmol/L (ref 135–146)
Total Bilirubin: 0.7 mg/dL (ref 0.2–1.2)
Total Protein: 6.7 g/dL (ref 6.1–8.1)

## 2021-01-18 LAB — CBC WITH DIFFERENTIAL/PLATELET
Absolute Monocytes: 561 cells/uL (ref 200–950)
Basophils Absolute: 38 cells/uL (ref 0–200)
Basophils Relative: 0.6 %
Eosinophils Absolute: 151 cells/uL (ref 15–500)
Eosinophils Relative: 2.4 %
HCT: 39.7 % (ref 35.0–45.0)
Hemoglobin: 13.4 g/dL (ref 11.7–15.5)
Lymphs Abs: 1562 cells/uL (ref 850–3900)
MCH: 31.2 pg (ref 27.0–33.0)
MCHC: 33.8 g/dL (ref 32.0–36.0)
MCV: 92.5 fL (ref 80.0–100.0)
MPV: 13.1 fL — ABNORMAL HIGH (ref 7.5–12.5)
Monocytes Relative: 8.9 %
Neutro Abs: 3988 cells/uL (ref 1500–7800)
Neutrophils Relative %: 63.3 %
Platelets: 137 10*3/uL — ABNORMAL LOW (ref 140–400)
RBC: 4.29 10*6/uL (ref 3.80–5.10)
RDW: 11.8 % (ref 11.0–15.0)
Total Lymphocyte: 24.8 %
WBC: 6.3 10*3/uL (ref 3.8–10.8)

## 2021-01-18 LAB — LIPID PANEL
Cholesterol: 177 mg/dL (ref <200)
HDL: 38 mg/dL — ABNORMAL LOW (ref >=50)
LDL Cholesterol (Calc): 108 mg/dL (calc) — ABNORMAL HIGH
Non-HDL Cholesterol (Calc): 139 mg/dL (calc) — ABNORMAL HIGH (ref <130)
Total CHOL/HDL Ratio: 4.7 (calc) (ref <5.0)
Triglycerides: 193 mg/dL — ABNORMAL HIGH (ref <150)

## 2021-01-18 LAB — TSH: TSH: 5.08 mIU/L — ABNORMAL HIGH (ref 0.40–4.50)

## 2021-01-18 LAB — HEMOGLOBIN A1C
Hgb A1c MFr Bld: 4 % of total Hgb (ref <5.7)
Mean Plasma Glucose: 68 mg/dL
eAG (mmol/L): 3.8 mmol/L

## 2021-01-19 ENCOUNTER — Other Ambulatory Visit: Payer: Self-pay

## 2021-01-19 ENCOUNTER — Encounter: Payer: Self-pay | Admitting: Nurse Practitioner

## 2021-01-19 ENCOUNTER — Ambulatory Visit (INDEPENDENT_AMBULATORY_CARE_PROVIDER_SITE_OTHER): Payer: BC Managed Care – PPO | Admitting: Nurse Practitioner

## 2021-01-19 VITALS — BP 168/88 | HR 92 | Ht 66.0 in | Wt 202.0 lb

## 2021-01-19 DIAGNOSIS — I10 Essential (primary) hypertension: Secondary | ICD-10-CM

## 2021-01-19 DIAGNOSIS — E782 Mixed hyperlipidemia: Secondary | ICD-10-CM | POA: Diagnosis not present

## 2021-01-19 DIAGNOSIS — E038 Other specified hypothyroidism: Secondary | ICD-10-CM

## 2021-01-19 MED ORDER — LEVOTHYROXINE SODIUM 25 MCG PO TABS: 25.0000 ug | ORAL_TABLET | Freq: Every day | ORAL | 0 refills | Status: DC

## 2021-01-19 NOTE — Assessment & Plan Note (Signed)
Chronic.  Cholesterol levels have significantly improved this visit.  Discussed The 10-year ASCVD risk score (Arnett DK, et al., 2019) is: 10.5%   Values used to calculate the score:     Age: 60 years     Sex: Female     Is Non-Hispanic African American: No     Diabetic: No     Tobacco smoker: No     Systolic Blood Pressure: 168 mmHg     Is BP treated: Yes     HDL Cholesterol: 38 mg/dL     Total Cholesterol: 177 mg/dL Patient reports side effects with statin and declines resuming atorvastatin today.  We briefly discussed Zetia, however she declines for now.  She would like to continue working on dietary and lifestyle changes.  Her LDL has reduced to almost less than 100 which is our goal.  I feel this is reasonable.  Follow up in 6 months.

## 2021-01-19 NOTE — Assessment & Plan Note (Signed)
Chronic.  BP elevated today in clinic.  I gave pt a BP log and asked her to check her blood pressure at home.  I would like her to report readings to me in 2 weeks.  Goal is less than 130/80.  Recent kidney function with electrolytes are normal.  If BP remains elevated at home, can consider increasing lisinopril.  Follow up 6 months as long as BP stable at home.

## 2021-01-19 NOTE — Assessment & Plan Note (Addendum)
Chronic.  Recent TSH slightly elevated, however at goal for the patient.  Will continue levothyroxine 25 mcg daily for now, refills sent in.  Recheck TSH with free T4 in 6 months.

## 2021-01-19 NOTE — Progress Notes (Signed)
Subjective:    Patient ID: Elizabeth Cobb, female    DOB: [birth year], 60 y.o.   MRN: [id number]  HPI: Elizabeth Cobb is a 60 y.o. female presenting for follow up.  Chief Complaint  Patient presents with   Hypertension   Hypothyroidism   Hyperlipidemia    Follows with behavioral health for generalized anxiety and depression.   HYPERTENSION / HYPERLIPIDEMIA Stopped taking statin medication about 9 months ago.  Has cut out sugar and stays away from starches. Tries to stay active.  She does not want to restart cholesterol medication.  She is taking HCTZ 25 mg daily and lisinopril 20 mg daily.   BP monitoring frequency: weekly Home BP: 120s/60s  Aspirin: no Recent stressors: no Recurrent headaches: no Visual changes: no Palpitations: no Dyspnea: no Chest pain: no Lower extremity edema: no Dizzy/lightheaded: no Myalgias: no LDL goal: less than 100 BP goal: less than 140/90 The 10-year ASCVD risk score (Arnett DK, et al., 2019) is: 10.5%   Values used to calculate the score:     Age: 48 years     Sex: Female     Is Non-Hispanic African American: No     Diabetic: No     Tobacco smoker: No     Systolic Blood Pressure: 168 mmHg     Is BP treated: Yes     HDL Cholesterol: 38 mg/dL     Total Cholesterol: 177 mg/dL   HYPOTHYROIDISM Currently taking levothyroxine 25 mcg daily for hypothyroidism.  Looks like she has always been subclinical in the past.   Thyroid control status:stable Satisfied with current treatment? yes Medication side effects: no Medication compliance: excellent compliance Etiology of hypothyroidism:  Recent dose adjustment:no Fatigue: no Cold intolerance: no Heat intolerance: no Weight gain: no Weight loss: no Constipation: no Diarrhea/loose stools: no Palpitations: no Lower extremity edema: no Anxiety/depressed mood: no  No Known Allergies  Outpatient Encounter Medications as of 01/19/2021  Medication Sig   diazePAM (VALIUM PO) Take by mouth  daily as needed.   FLUoxetine (PROZAC) 20 MG capsule Take 3 capsules (60 mg total) by mouth daily.   hydrochlorothiazide (HYDRODIURIL) 25 MG tablet TAKE (1) TABLET BY MOUTH ONCE DAILY.   lisinopril (ZESTRIL) 20 MG tablet TAKE ONE TABLET BY MOUTH ONCE DAILY.   mometasone (ELOCON) 0.1 % cream Apply 1 application topically daily. Use on hands and behind ears PRN   Omega-3 Fatty Acids (OMEGA 3 PO) Take by mouth.   OVER THE COUNTER MEDICATION Garlique   [DISCONTINUED] atorvastatin (LIPITOR) 40 MG tablet Take 1 tablet (40 mg total) by mouth daily.   [DISCONTINUED] levothyroxine (SYNTHROID) 25 MCG tablet Take 1 tablet (25 mcg total) by mouth daily before breakfast.   [DISCONTINUED] OVER THE COUNTER MEDICATION Omega XL   ALPRAZolam (XANAX) 0.5 MG tablet TAKE 1/2 TO 1 TABLET(0.25 TO 0.5 MG) BY MOUTH TWICE DAILY AS NEEDED FOR ANXIETY   b complex vitamins tablet Take 1 tablet by mouth daily.   chlorpheniramine (CHLOR-TRIMETON) 4 MG tablet Take 4 mg by mouth 2 (two) times daily as needed for allergies.   Cholecalciferol (VITAMIN D3) 2000 units TABS Take by mouth.   estradiol (ESTRACE) 0.1 MG/GM vaginal cream Place 1 g vaginally at bedtime. Use 1 gram every night for 2 weeks.  Then, use 500 mg one to three times per week.   fluticasone (FLONASE) 50 MCG/ACT nasal spray INHALE 2 SPRAYS IN EACH NOSTRIL ONCE DAILY.   levothyroxine (SYNTHROID) 25 MCG tablet Take 1 tablet (25 mcg  total) by mouth daily before breakfast.   pyridOXINE (VITAMIN B-6) 100 MG tablet Take 100 mg by mouth daily.   [DISCONTINUED] sulfamethoxazole-trimethoprim (BACTRIM DS) 800-160 MG tablet Take 1 tablet by mouth 2 (two) times daily.   No facility-administered encounter medications on file as of 01/19/2021.    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 Interim medical history since our last visit reviewed.  Review of Systems Per HPI unless specifically indicated above     Objective:    BP (!) 168/88    Pulse 92    Ht  (1.676 m)    Wt 202 lb (91.6 kg)    SpO2 97%    BMI 32.60 kg/m   Wt Readings from Last 3 Encounters:  01/19/21 202 lb (91.6 kg)  01/18/20 194 lb (88 kg)  04/15/19 204 lb 12.8 oz (92.9 kg)    Physical Exam Vitals and nursing note reviewed.  Constitutional:      General: She is not in acute distress.    Appearance: Normal appearance. She is not toxic-appearing.  HENT:     Head: Normocephalic and atraumatic.     Right Ear: Tympanic membrane, ear canal and external ear normal. There is no impacted cerumen.     Left Ear: Tympanic membrane, ear canal and external ear normal. There is no impacted cerumen.     Nose: Nose normal. No congestion or rhinorrhea.     Mouth/Throat:     Mouth: Mucous membranes are moist.     Pharynx: Oropharynx is clear. No oropharyngeal exudate or posterior oropharyngeal erythema.  Eyes:     General: No scleral icterus.       Right eye: No discharge.        Left eye: No discharge.     Extraocular Movements: Extraocular movements intact.  Cardiovascular:     Rate and Rhythm: Normal rate and regular rhythm.     Heart sounds: Normal heart sounds. No murmur heard. Pulmonary:     Effort: Pulmonary effort is normal. No respiratory distress.     Breath sounds: Normal breath sounds. No wheezing, rhonchi or rales.  Abdominal:     General: Abdomen is flat. Bowel sounds are normal. There is no distension.     Palpations: Abdomen is soft.     Tenderness: There is no right CVA  tenderness or left CVA tenderness.  Musculoskeletal:     Cervical back: Normal range of motion.     Right lower leg: No edema.     Left lower leg: No edema.  Lymphadenopathy:     Cervical: No cervical adenopathy.  Skin:    General: Skin is warm and dry.     Capillary Refill: Capillary refill takes less than 2 seconds.     Coloration: Skin is not jaundiced or pale.     Findings: No erythema.  Neurological:     Mental Status: She is alert and oriented to person, place, and time.     Motor: No weakness.  Gait: Gait normal.  Psychiatric:        Mood and Affect: Mood normal.        Behavior: Behavior normal.        Thought Content: Thought content normal.        Judgment: Judgment normal.    Results for orders placed or performed in visit on 01/17/21  Lipid Panel  Result Value Ref Range   Cholesterol 177 <200 mg/dL   HDL 38 (L) > OR = 50 mg/dL   Triglycerides 110 (H) <150 mg/dL   LDL Cholesterol (Calc) 108 (H) mg/dL (calc)   Total CHOL/HDL Ratio 4.7 <5.0 (calc)   Non-HDL Cholesterol (Calc) 139 (H) <130 mg/dL (calc)  CBC with Differential/Platelet  Result Value Ref Range   WBC 6.3 3.8 - 10.8 Thousand/uL   RBC 4.29 3.80 - 5.10 Million/uL   Hemoglobin 13.4 11.7 - 15.5 g/dL   HCT 21.1 17.3 - 56.7 %   MCV 92.5 80.0 - 100.0 fL   MCH 31.2 27.0 - 33.0 pg   MCHC 33.8 32.0 - 36.0 g/dL   RDW 01.4 10.3 - 01.3 %   Platelets 137 (L) 140 - 400 Thousand/uL   MPV 13.1 (H) 7.5 - 12.5 fL   Neutro Abs 3,988 1,500 - 7,800 cells/uL   Lymphs Abs 1,562 850 - 3,900 cells/uL   Absolute Monocytes 561 200 - 950 cells/uL   Eosinophils Absolute 151 15 - 500 cells/uL   Basophils Absolute 38 0 - 200 cells/uL   Neutrophils Relative % 63.3 %   Total Lymphocyte 24.8 %   Monocytes Relative 8.9 %   Eosinophils Relative 2.4 %   Basophils Relative 0.6 %  Comprehensive metabolic panel  Result Value Ref Range   Glucose, Bld 108 (H) 65 - 99 mg/dL   BUN 24 7 - 25 mg/dL   Creat 1.43 8.88 - 7.57 mg/dL    BUN/Creatinine Ratio NOT APPLICABLE 6 - 22 (calc)   Sodium 137 135 - 146 mmol/L   Potassium 4.3 3.5 - 5.3 mmol/L   Chloride 100 98 - 110 mmol/L   CO2 28 20 - 32 mmol/L   Calcium 9.2 8.6 - 10.4 mg/dL   Total Protein 6.7 6.1 - 8.1 g/dL   Albumin 4.3 3.6 - 5.1 g/dL   Globulin 2.4 1.9 - 3.7 g/dL (calc)   AG Ratio 1.8 1.0 - 2.5 (calc)   Total Bilirubin 0.7 0.2 - 1.2 mg/dL   Alkaline phosphatase (APISO) 52 37 - 153 U/L   AST 20 10 - 35 U/L   ALT 19 6 - 29 U/L  Hemoglobin A1c  Result Value Ref Range   Hgb A1c MFr Bld 4.0 <5.7 % of total Hgb   Mean Plasma Glucose 68 mg/dL   eAG (mmol/L) 3.8 mmol/L  TSH  Result Value Ref Range   TSH 5.08 (H) 0.40 - 4.50 mIU/L      Assessment & Plan:   Problem List Items Addressed This Visit       Cardiovascular and Mediastinum   Essential hypertension    Chronic.  BP elevated today in clinic.  I gave pt a BP log and asked her to check her blood pressure at home.  I would like her to report readings to me in 2 weeks.  Goal is less than 130/80.  Recent kidney function with electrolytes are normal.  If BP remains elevated at home, can consider increasing lisinopril.  Follow up 6 months as long as BP stable at home.  Endocrine   Subclinical hypothyroidism    Chronic.  Recent TSH slightly elevated, however at goal for the patient.  Will continue levothyroxine 25 mcg daily for now, refills sent in.  Recheck TSH with free T4 in 6 months.       Relevant Medications   levothyroxine (SYNTHROID) 25 MCG tablet     Other   Mixed hyperlipidemia - Primary    Chronic.  Cholesterol levels have significantly improved this visit.  Discussed The 10-year ASCVD risk score (Arnett DK, et al., 2019) is: 10.5%   Values used to calculate the score:     Age: 72 years     Sex: Female     Is Non-Hispanic African American: No     Diabetic: No     Tobacco smoker: No     Systolic Blood Pressure: 168 mmHg     Is BP treated: Yes     HDL Cholesterol: 38 mg/dL      Total Cholesterol: 177 mg/dL Patient reports side effects with statin and declines resuming atorvastatin today.  We briefly discussed Zetia, however she declines for now.  She would like to continue working on dietary and lifestyle changes.  Her LDL has reduced to almost less than 100 which is our goal.  I feel this is reasonable.  Follow up in 6 months.         Follow up plan: Return in about 6 months (around 07/20/2021) for follow up.

## 2021-02-23 ENCOUNTER — Other Ambulatory Visit: Payer: Self-pay

## 2021-02-23 DIAGNOSIS — E038 Other specified hypothyroidism: Secondary | ICD-10-CM

## 2021-02-23 DIAGNOSIS — I10 Essential (primary) hypertension: Secondary | ICD-10-CM

## 2021-02-23 MED ORDER — HYDROCHLOROTHIAZIDE 25 MG PO TABS: ORAL_TABLET | ORAL | 3 refills | Status: DC

## 2021-02-23 MED ORDER — LISINOPRIL 20 MG PO TABS: 20.0000 mg | ORAL_TABLET | Freq: Every day | ORAL | 3 refills | Status: DC

## 2021-02-23 MED ORDER — LEVOTHYROXINE SODIUM 25 MCG PO TABS: 25.0000 ug | ORAL_TABLET | Freq: Every day | ORAL | 3 refills | Status: DC

## 2021-03-21 ENCOUNTER — Other Ambulatory Visit: Payer: Self-pay | Admitting: Psychiatry

## 2021-03-23 NOTE — Telephone Encounter (Signed)
Pt called  and was checking on the status of her refill . She would like to pick this up today. [facility] is her provider

## 2021-03-23 NOTE — Telephone Encounter (Signed)
Last filled 1/16

## 2021-05-10 ENCOUNTER — Ambulatory Visit (INDEPENDENT_AMBULATORY_CARE_PROVIDER_SITE_OTHER): Payer: 59 | Admitting: Physician Assistant

## 2021-05-10 ENCOUNTER — Encounter: Payer: Self-pay | Admitting: Physician Assistant

## 2021-05-10 DIAGNOSIS — F331 Major depressive disorder, recurrent, moderate: Secondary | ICD-10-CM | POA: Diagnosis not present

## 2021-05-10 DIAGNOSIS — F411 Generalized anxiety disorder: Secondary | ICD-10-CM | POA: Diagnosis not present

## 2021-05-10 DIAGNOSIS — Z720 Tobacco use: Secondary | ICD-10-CM

## 2021-05-10 MED ORDER — ALPRAZOLAM 0.5 MG PO TABS: 0.5000 mg | ORAL_TABLET | Freq: Three times a day (TID) | ORAL | 1 refills | Status: DC | PRN

## 2021-05-10 MED ORDER — FLUOXETINE HCL 40 MG PO CAPS: 40.0000 mg | ORAL_CAPSULE | Freq: Every day | ORAL | 3 refills | Status: DC

## 2021-05-10 NOTE — Progress Notes (Signed)
[facility] ? ?Patient ID: Elizabeth Cobb,  ?MRN: [id number] ? ?PCP: Susy Frizzle, MD ? ?Date of Evaluation: 05/10/2021 ?Time spent:20 minutes ? ?Chief Complaint:  ?Chief Complaint   ?Anxiety; Depression; Follow-up ?  ? ? ?HISTORY/CURRENT STATUS: ?HPI For annual med check. ? ?Patient denies loss of interest in usual activities and is able to enjoy things.  Denies decreased energy or motivation.  Appetite has not changed.  No extreme sadness, tearfulness, or feelings of hopelessness.  Denies any changes in concentration, making decisions or remembering things.  Denies suicidal or homicidal thoughts. ? ?Had a falling out with her brother, has been taking more Xanax b/c of the stress.  Sometimes if she does not take 2 pills at a time it does not help with the anxiety at all.  She has not run out of the Xanax but has come close at the end of the month.  Sleeping ok some of the time but when really anxious, has a hard time falling asleep. Worries about a lot of different things.  One of her cats died recently which was hard too.  Her husband is in poor health which also makes her anxious. ? ?Patient denies increased energy with decreased need for sleep, no increased talkativeness, no racing thoughts, no impulsivity or risky behaviors, no increased spending, no increased libido, no grandiosity, no increased irritability or anger, and no hallucinations. ? ?Denies dizziness, syncope, seizures, numbness, tingling, tremor, tics, unsteady gait, slurred speech, confusion. Denies muscle or joint pain, stiffness, or dystonia. ? ?Individual Medical History/ Review of Systems: Changes? :No   ? ?Past medications for mental health diagnoses include: ?Chantix, Prozac, Xanax, Remeron, Klonopin, Wellbutrin caused increased agitation.  ? ?Allergies: Patient has no known allergies. ? ?Current Medications:  ?Current Outpatient Medications:  ?  b complex vitamins tablet, Take 1 tablet by mouth daily., Disp: , Rfl:  ?   chlorpheniramine (CHLOR-TRIMETON) 4 MG tablet, Take 4 mg by mouth 2 (two) times daily as needed for allergies., Disp: , Rfl:  ?  Cholecalciferol (VITAMIN D3) 2000 units TABS, Take by mouth., Disp: , Rfl:  ?  FLUoxetine (PROZAC) 40 MG capsule, Take 1 capsule (40 mg total) by mouth daily., Disp: 180 capsule, Rfl: 3 ?  hydrochlorothiazide (HYDRODIURIL) 25 MG tablet, TAKE (1) TABLET BY MOUTH ONCE DAILY., Disp: 90 tablet, Rfl: 3 ?  levothyroxine (SYNTHROID) 25 MCG tablet, Take 1 tablet (25 mcg total) by mouth daily before breakfast., Disp: 90 tablet, Rfl: 3 ?  lisinopril (ZESTRIL) 20 MG tablet, Take 1 tablet (20 mg total) by mouth daily., Disp: 90 tablet, Rfl: 3 ?  mometasone (ELOCON) 0.1 % cream, Apply 1 application topically daily. Use on hands and behind ears PRN, Disp: 45 g, Rfl: 2 ?  Omega-3 Fatty Acids (OMEGA 3 PO), Take by mouth., Disp: , Rfl:  ?  OVER THE COUNTER MEDICATION, Garlique, Disp: , Rfl:  ?  pyridOXINE (VITAMIN B-6) 100 MG tablet, Take 100 mg by mouth daily., Disp: , Rfl:  ?  ALPRAZolam (XANAX) 0.5 MG tablet, Take 1 tablet (0.5 mg total) by mouth 3 (three) times daily as needed for anxiety., Disp: 90 tablet, Rfl: 1 ?  diazePAM (VALIUM PO), Take by mouth daily as needed. (Patient not taking: Reported on 05/10/2021), Disp: , Rfl:  ?  estradiol (ESTRACE) 0.1 MG/GM vaginal cream, Place 1 g vaginally at bedtime. Use 1 gram every night for 2 weeks.  Then, use 500 mg one to three times per week. (Patient not taking: Reported  on 05/10/2021), Disp: 42.5 g, Rfl: 3 ?  fluticasone (FLONASE) 50 MCG/ACT nasal spray, INHALE 2 SPRAYS IN EACH NOSTRIL ONCE DAILY. (Patient not taking: Reported on 05/10/2021), Disp: 16 g, Rfl: 3 ?Medication Side Effects: none ? ?Family Medical/ Social History: Changes?  Is retired. ? ?MENTAL HEALTH EXAM: ? ?There were no vitals taken for this visit.There is no height or weight on file to calculate BMI.  ?General Appearance: Casual, Neat and Well Groomed  ?Eye Contact:  Good  ?Speech:  Clear and  Coherent and Normal Rate  ?Volume:  Normal  ?Mood:  Anxious  ?Affect:  Anxious  ?Thought Process:  Goal Directed and Descriptions of Associations: Intact  ?Orientation:  Full (Time, Place, and Person)  ?Thought Content: Logical   ?Suicidal Thoughts:  No  ?Homicidal Thoughts:  No  ?Memory:  WNL  ?Judgement:  Good  ?Insight:  Good  ?Psychomotor Activity:  Normal  ?Concentration:  Concentration: Good  ?Recall:  Good  ?Fund of Knowledge: Good  ?Language: Good  ?Assets:  Desire for Improvement  ?ADL's:  Intact  ?Cognition: WNL  ?Prognosis:  Good  ? ? ?DIAGNOSES:  ?  ICD-10-CM   ?1. Generalized anxiety disorder  F41.1   ?  ?2. Major depressive disorder, recurrent episode, moderate (HCC)  F33.1   ?  ?3. Vapes nicotine containing substance  Z72.0   ?  ? ? ?Receiving Psychotherapy: No  ? ? ?RECOMMENDATIONS:  ?PDMP was reviewed.  Last Xanax 04/25/2021. ?I provided 20 minutes of face to face time during this encounter, including time spent before and after the visit in records review, medical decision making, counseling pertinent to today's visit, and charting.  ?Discussed the anxiety.  Recommend increasing the Xanax from twice daily to 3 times daily.  It is okay for her to occasionally take 2 of the Xanax at the same time, just not over 3 pills/day.  Reminded her that tolerance can build quickly and there will be a time when I cannot continue to increase the dose.  She verbalizes understanding, I have no concerns of abuse or diversion. ?Also recommend increasing the Prozac which should help decrease the anxiety and hopefully she will not need the Xanax as much once that becomes effective.  She understands and agrees. ?Vaping cessation discussed.  ? ?Increase Xanax 0.5 mg, to 1 p.o. 3 times daily (or can take 2 at 1 time, just not over 3 pills/day.) ?Increase Prozac to 40 mg, 2 p.o. daily. ?Return in 2 months. ? ?Donnal Moat, PA-C  ?

## 2021-07-20 ENCOUNTER — Ambulatory Visit: Payer: Self-pay | Admitting: Family Medicine

## 2021-07-27 ENCOUNTER — Ambulatory Visit (INDEPENDENT_AMBULATORY_CARE_PROVIDER_SITE_OTHER): Payer: 59 | Admitting: Physician Assistant

## 2021-07-27 ENCOUNTER — Encounter: Payer: Self-pay | Admitting: Physician Assistant

## 2021-07-27 DIAGNOSIS — F411 Generalized anxiety disorder: Secondary | ICD-10-CM

## 2021-07-27 DIAGNOSIS — F3342 Major depressive disorder, recurrent, in full remission: Secondary | ICD-10-CM

## 2021-07-27 MED ORDER — ALPRAZOLAM 0.5 MG PO TABS: 0.5000 mg | ORAL_TABLET | Freq: Three times a day (TID) | ORAL | 5 refills | Status: DC | PRN

## 2021-07-27 NOTE — Progress Notes (Unsigned)
[facility]  Patient ID: Elizabeth Cobb,  MRN: [id number]  PCP: Donita Brooks, MD  Date of Evaluation: 07/27/2021 Time spent:20 minutes  Chief Complaint:  Chief Complaint   Anxiety; Depression; Follow-up      HISTORY/CURRENT STATUS: HPI For annual med check.      Patient denies increased energy with decreased need for sleep, no increased talkativeness, no racing thoughts, no impulsivity or risky behaviors, no increased spending, no increased libido, no grandiosity, no increased irritability or anger, and no hallucinations.  Denies dizziness, syncope, seizures, numbness, tingling, tremor, tics, unsteady gait, slurred speech, confusion. Denies muscle or joint pain, stiffness, or dystonia.  Individual Medical History/ Review of Systems: Changes? :No    Past medications for mental health diagnoses include: Chantix, Prozac, Xanax, Remeron, Klonopin, Wellbutrin caused increased agitation.   Allergies: Patient has no known allergies.  Current Medications:  Current Outpatient Medications:    b complex vitamins tablet, Take 1 tablet by mouth daily., Disp: , Rfl:    Cholecalciferol (VITAMIN D3) 2000 units TABS, Take by mouth., Disp: , Rfl:    FLUoxetine (PROZAC) 40 MG capsule, Take 1 capsule (40 mg total) by mouth daily. (Patient taking differently: Take 80 mg by mouth daily.), Disp: 180 capsule, Rfl: 3   hydrochlorothiazide (HYDRODIURIL) 25 MG tablet, TAKE (1) TABLET BY MOUTH ONCE DAILY., Disp: 90 tablet, Rfl: 3   levothyroxine (SYNTHROID) 25 MCG tablet, Take 1 tablet (25 mcg total) by mouth daily before breakfast., Disp: 90 tablet, Rfl: 3   lisinopril (ZESTRIL) 20 MG tablet, Take 1 tablet (20 mg total) by mouth daily., Disp: 90 tablet, Rfl: 3   MELATONIN PO, Take by mouth., Disp: , Rfl:    mometasone (ELOCON) 0.1 % cream, Apply 1 application topically daily. Use on hands and behind ears PRN, Disp: 45 g, Rfl: 2   Omega-3 Fatty Acids (OMEGA 3 PO), Take by mouth.,  Disp: , Rfl:    OVER THE COUNTER MEDICATION, [place], Disp: , Rfl:    pyridOXINE (VITAMIN B-6) 100 MG tablet, Take 100 mg by mouth daily., Disp: , Rfl:    ALPRAZolam (XANAX) 0.5 MG tablet, Take 1 tablet (0.5 mg total) by mouth 3 (three) times daily as needed for anxiety., Disp: 90 tablet, Rfl: 5   chlorpheniramine (CHLOR-TRIMETON) 4 MG tablet, Take 4 mg by mouth 2 (two) times daily as needed for allergies. (Patient not taking: Reported on 07/27/2021), Disp: , Rfl:    diazePAM (VALIUM PO), Take by mouth daily as needed. (Patient not taking: Reported on 05/10/2021), Disp: , Rfl:    estradiol (ESTRACE) 0.1 MG/GM vaginal cream, Place 1 g vaginally at bedtime. Use 1 gram every night for 2 weeks.  Then, use 500 mg one to three times per week. (Patient not taking: Reported on 05/10/2021), Disp: 42.5 g, Rfl: 3   fluticasone (FLONASE) 50 MCG/ACT nasal spray, INHALE 2 SPRAYS IN EACH NOSTRIL ONCE DAILY. (Patient not taking: Reported on 05/10/2021), Disp: 16 g, Rfl: 3 Medication Side Effects: none  Family Medical/ Social History: Changes?  Is retired.  MENTAL HEALTH EXAM:  There were no vitals taken for this visit.There is no height or weight on file to calculate BMI.  General Appearance: Casual, Neat and Well Groomed  Eye Contact:  Good  Speech:  Clear and Coherent and Normal Rate  Volume:  Normal  Mood:  Euthymic  Affect:  Congruent  Thought Process:  Goal Directed and Descriptions of Associations: Intact  Orientation:  Full (Time, Place, and Person)  Thought  Content: Logical   Suicidal Thoughts:  No  Homicidal Thoughts:  No  Memory:  WNL  Judgement:  Good  Insight:  Good  Psychomotor Activity:  Normal  Concentration:  Concentration: Good  Recall:  Good  Fund of Knowledge: Good  Language: Good  Assets:  Desire for Improvement  ADL's:  Intact  Cognition: WNL  Prognosis:  Good    DIAGNOSES:    ICD-10-CM   1. Generalized anxiety disorder  F41.1     2. Recurrent major depressive disorder, in  full remission (HCC)  F33.42       Receiving Psychotherapy: No   RECOMMENDATIONS:  PDMP was reviewed.  Last Xanax filled 06/22/2021. I provided 20 minutes of face to face time during this encounter, including time spent before and after the visit in records review, medical decision making, counseling pertinent to today's visit, and charting.  She's doing a lot better so no change in meds.  Continue Xanax 0.5 mg, 1 p.o. 3 times daily (or can take 2 at 1 time, just not over 3 pills/day.) Continue Prozac 40 mg, 2 p.o. daily. (She takes 60 mg some days.) Return in 9 months.  Melony Overly, PA-C

## 2022-01-24 ENCOUNTER — Ambulatory Visit (INDEPENDENT_AMBULATORY_CARE_PROVIDER_SITE_OTHER): Payer: 59 | Admitting: Family Medicine

## 2022-01-24 ENCOUNTER — Encounter: Payer: Self-pay | Admitting: Family Medicine

## 2022-01-24 VITALS — BP 144/82 | HR 69 | Temp 98.3°F | Ht 66.0 in | Wt 202.0 lb

## 2022-01-24 DIAGNOSIS — Z0000 Encounter for general adult medical examination without abnormal findings: Secondary | ICD-10-CM | POA: Diagnosis not present

## 2022-01-24 DIAGNOSIS — Z13820 Encounter for screening for osteoporosis: Secondary | ICD-10-CM | POA: Diagnosis not present

## 2022-01-24 DIAGNOSIS — Z1211 Encounter for screening for malignant neoplasm of colon: Secondary | ICD-10-CM | POA: Diagnosis not present

## 2022-01-24 DIAGNOSIS — I10 Essential (primary) hypertension: Secondary | ICD-10-CM | POA: Diagnosis not present

## 2022-01-24 DIAGNOSIS — Z1231 Encounter for screening mammogram for malignant neoplasm of breast: Secondary | ICD-10-CM | POA: Diagnosis not present

## 2022-01-24 DIAGNOSIS — E038 Other specified hypothyroidism: Secondary | ICD-10-CM

## 2022-01-24 MED ORDER — LEVOTHYROXINE SODIUM 25 MCG PO TABS: 25.0000 ug | ORAL_TABLET | Freq: Every day | ORAL | 3 refills | Status: DC

## 2022-01-24 MED ORDER — HYDROCHLOROTHIAZIDE 25 MG PO TABS: ORAL_TABLET | ORAL | 3 refills | Status: DC

## 2022-01-24 MED ORDER — LISINOPRIL 20 MG PO TABS: 20.0000 mg | ORAL_TABLET | Freq: Every day | ORAL | 3 refills | Status: DC

## 2022-01-24 NOTE — Patient Instructions (Signed)
It was great to meet you today and I'm excited to have you join the [facility] practice. I hope you had a positive experience today! If you feel so inclined, please feel free to recommend our practice to friends and family. Maylene Crocker, FNP-C  

## 2022-01-24 NOTE — Assessment & Plan Note (Signed)

## 2022-01-24 NOTE — Progress Notes (Signed)
Complete physical exam  Patient: Elizabeth Cobb Cobb   DOB: 11/03/1960   61 y.o. Female  MRN: [id number]  Subjective:    Chief Complaint  Patient presents with   Annual Exam    Elizabeth Cobb Erway is a 61 y.o. female who presents today for a complete physical exam. She reports consuming a general and low sodium diet. The patient does not participate in regular exercise at present. She generally feels well. She reports sleeping well. She does not have additional problems to discuss today.   Hypertension: well controlled on current regimen. Home readings <130/80. She denies chest pain, shortness of breath, dyspnea, swelling of extremities, headache, vision changes.  Hypothyroidism: well controlled on current regimen. Denies palpitations, tremors, intolerance to cold or heat, diarrhea, constipation, or weight changes.   Most recent fall risk assessment:    01/24/2022    9:53 AM  Fall Risk   Falls in the past year? 0     Most recent depression screenings:    01/24/2022    9:53 AM 01/18/2020    3:06 PM  PHQ 2/9 Scores  PHQ - 2 Score 0 0  PHQ- 9 Score 4   Exception Documentation  Medical reason    Vision:Within last year and Dental: No current dental problems and Receives regular dental care  Past Medical History:  Diagnosis Date   Screening for hyperlipidemia 05/02/2018   No past surgical history on file. Family History  Problem Relation Age of Onset   Cancer Mother        Some type of Leukemia   Lung cancer Father    Bipolar disorder Sister    No Known Allergies    Patient Care Team: Park MeoHoward, Treniece Holsclaw S, FNP as PCP - General (Family Medicine)   Outpatient Medications Prior to Visit  Medication Sig   ALPRAZolam (XANAX) 0.5 MG tablet Take 1 tablet (0.5 mg total) by mouth 3 (three) times daily as needed for anxiety.   b complex vitamins tablet Take 1 tablet by mouth daily.   chlorpheniramine (CHLOR-TRIMETON) 4 MG tablet Take 4 mg by mouth 2 (two) times daily as needed for  allergies.   Cholecalciferol (VITAMIN D3) 2000 units TABS Take by mouth.   FLUoxetine (PROZAC) 40 MG capsule Take 1 capsule (40 mg total) by mouth daily. (Patient taking differently: Take 80 mg by mouth daily.)   MELATONIN PO Take by mouth.   mometasone (ELOCON) 0.1 % cream Apply 1 application topically daily. Use on hands and behind ears PRN   Omega-3 Fatty Acids (OMEGA 3 PO) Take by mouth.   pyridOXINE (VITAMIN B-6) 100 MG tablet Take 100 mg by mouth daily.   [DISCONTINUED] hydrochlorothiazide (HYDRODIURIL) 25 MG tablet TAKE (1) TABLET BY MOUTH ONCE DAILY.   [DISCONTINUED] levothyroxine (SYNTHROID) 25 MCG tablet Take 1 tablet (25 mcg total) by mouth daily before breakfast.   [DISCONTINUED] lisinopril (ZESTRIL) 20 MG tablet Take 1 tablet (20 mg total) by mouth daily.   [DISCONTINUED] diazePAM (VALIUM PO) Take by mouth daily as needed. (Patient not taking: Reported on 05/10/2021)   [DISCONTINUED] estradiol (ESTRACE) 0.1 MG/GM vaginal cream Place 1 g vaginally at bedtime. Use 1 gram every night for 2 weeks.  Then, use 500 mg one to three times per week. (Patient not taking: Reported on 01/24/2022)   [DISCONTINUED] fluticasone (FLONASE) 50 MCG/ACT nasal spray INHALE 2 SPRAYS IN EACH NOSTRIL ONCE DAILY. (Patient not taking: Reported on 01/24/2022)   [DISCONTINUED] OVER THE COUNTER MEDICATION Garlique   No facility-administered medications prior  to visit.    Review of Systems  Constitutional: Negative.   HENT: Negative.    Eyes: Negative.   Respiratory: Negative.    Cardiovascular: Negative.   Gastrointestinal: Negative.   Genitourinary: Negative.   Musculoskeletal: Negative.   Skin: Negative.   Neurological: Negative.   Endo/Heme/Allergies: Negative.   Psychiatric/Behavioral: Negative.    All other systems reviewed and are negative.     The 10-year ASCVD risk score (Arnett DK, et al., 2019) is: 8.5%   Values used to calculate the score:     Age: 44 years     Sex: Female     Is  Non-Hispanic African American: No     Diabetic: No     Tobacco smoker: No     Systolic Blood Pressure: 144 mmHg     Is BP treated: Yes     HDL Cholesterol: 38 mg/dL     Total Cholesterol: 177 mg/dL     Objective:     BP (!) 144/82   Pulse 69   Temp 98.3 F (36.8 C) (Oral)   Ht 5\' 6"  (1.676 m)   Wt 202 lb (91.6 kg)   SpO2 98%   BMI 32.60 kg/m  BP Readings from Last 3 Encounters:  01/24/22 (!) 144/82  01/19/21 (!) 168/88  01/18/20 136/80   Wt Readings from Last 3 Encounters:  01/24/22 202 lb (91.6 kg)  01/19/21 202 lb (91.6 kg)  01/18/20 194 lb (88 kg)      Physical Exam Vitals and nursing note reviewed.  Constitutional:      Appearance: Normal appearance. She is normal weight.  HENT:     Head: Normocephalic and atraumatic.     Right Ear: Tympanic membrane, ear canal and external ear normal.     Left Ear: Tympanic membrane, ear canal and external ear normal.     Nose: Nose normal.     Mouth/Throat:     Mouth: Mucous membranes are moist.     Pharynx: Oropharynx is clear.  Eyes:     Extraocular Movements: Extraocular movements intact.     Conjunctiva/sclera: Conjunctivae normal.     Pupils: Pupils are equal, round, and reactive to light.  Cardiovascular:     Rate and Rhythm: Normal rate and regular rhythm.     Pulses: Normal pulses.     Heart sounds: Normal heart sounds.  Pulmonary:     Effort: Pulmonary effort is normal.     Breath sounds: Normal breath sounds.  Abdominal:     General: Bowel sounds are normal.     Palpations: Abdomen is soft.  Musculoskeletal:        General: Normal range of motion.     Cervical back: Normal range of motion and neck supple.  Skin:    General: Skin is warm and dry.     Capillary Refill: Capillary refill takes less than 2 seconds.  Neurological:     General: No focal deficit present.     Mental Status: She is alert and oriented to person, place, and time. Mental status is at baseline.  Psychiatric:        Mood and  Affect: Mood normal.        Behavior: Behavior normal.        Thought Content: Thought content normal.        Judgment: Judgment normal.      No results found for any visits on 01/24/22. Last CBC Lab Results  Component Value Date   WBC 6.3 01/17/2021  HGB 13.4 01/17/2021   HCT 39.7 01/17/2021   MCV 92.5 01/17/2021   MCH 31.2 01/17/2021   RDW 11.8 01/17/2021   PLT 137 (L) 01/17/2021   Last metabolic panel Lab Results  Component Value Date   GLUCOSE 108 (H) 01/17/2021   NA 137 01/17/2021   K 4.3 01/17/2021   CL 100 01/17/2021   CO2 28 01/17/2021   BUN 24 01/17/2021   CREATININE 0.67 01/17/2021   GFRNONAA 86 01/18/2020   CALCIUM 9.2 01/17/2021   PROT 6.7 01/17/2021   BILITOT 0.7 01/17/2021   AST 20 01/17/2021   ALT 19 01/17/2021   Last lipids Lab Results  Component Value Date   CHOL 177 01/17/2021   HDL 38 (L) 01/17/2021   LDLCALC 108 (H) 01/17/2021   TRIG 193 (H) 01/17/2021   CHOLHDL 4.7 01/17/2021   Last hemoglobin A1c Lab Results  Component Value Date   HGBA1C 4.0 01/17/2021   Last thyroid functions Lab Results  Component Value Date   TSH 5.08 (H) 01/17/2021        Assessment & Plan:    Routine Health Maintenance and Physical Exam  Immunization History  Administered Date(s) Administered   Influenza,inj,Quad PF,6+ Mos 01/05/2019   Tdap 10/28/2017    Health Maintenance  Topic Date Due   COVID-19 Vaccine (1) 02/09/2022 (Originally 08/09/1960)   Zoster Vaccines- Shingrix (1 of 2) 04/25/2022 (Originally 02/09/2008)   MAMMOGRAM  01/25/2023 (Originally 11/22/2019)   COLONOSCOPY (Pts 45-82yrs Insurance coverage will need to be confirmed)  01/25/2023 (Originally 02/09/2003)   PAP SMEAR-Modifier  01/18/2023   DTaP/Tdap/Td (2 - Td or Tdap) 10/29/2027   Hepatitis C Screening  Completed   HIV Screening  Completed   HPV VACCINES  Aged Out   INFLUENZA VACCINE  Discontinued    Discussed health benefits of physical activity, and encouraged her to engage in  regular exercise appropriate for her age and condition.  Problem List Items Addressed This Visit       Cardiovascular and Mediastinum   Essential hypertension   Relevant Medications   hydrochlorothiazide (HYDRODIURIL) 25 MG tablet   lisinopril (ZESTRIL) 20 MG tablet   Other Relevant Orders   CBC with Differential/Platelet   COMPLETE METABOLIC PANEL WITH GFR   Lipid panel   TSH     Endocrine   Subclinical hypothyroidism   Relevant Medications   levothyroxine (SYNTHROID) 25 MCG tablet   Other Relevant Orders   CBC with Differential/Platelet   COMPLETE METABOLIC PANEL WITH GFR   Lipid panel   TSH     Other   Physical exam, annual - Primary    Today your medical history was reviewed and routine physical exam with labs was performed. Recommend 150 minutes of moderate intensity exercise weekly and consuming a well-balanced diet. Advised to stop smoking if a smoker, avoid smoking if a non-smoker, limit alcohol consumption to 1 drink per day for women and 2 drinks per day for men, and avoid illicit drug use. Counseled on safe sex practices and offered STI testing today. Counseled on the importance of sunscreen use. Counseled in mental health awareness and when to seek medical care. Vaccine maintenance discussed. Appropriate health maintenance items reviewed. Return to office in 1 year for annual physical exam.       Relevant Orders   CBC with Differential/Platelet   COMPLETE METABOLIC PANEL WITH GFR   Lipid panel   TSH   Other Visit Diagnoses     Encounter for screening mammogram for malignant neoplasm  of breast       Relevant Orders   MM DIGITAL SCREENING BILATERAL   Screening for osteoporosis       Relevant Orders   DG Bone Density   Colon cancer screening       Relevant Orders   Cologuard      Return in about 1 year (around 01/25/2023) for annual physical.     Park Meo, FNP

## 2022-01-25 LAB — CBC WITH DIFFERENTIAL/PLATELET
Absolute Monocytes: 518 cells/uL (ref 200–950)
Basophils Absolute: 38 cells/uL (ref 0–200)
Basophils Relative: 0.8 %
Eosinophils Absolute: 158 cells/uL (ref 15–500)
Eosinophils Relative: 3.3 %
HCT: 37.8 % (ref 35.0–45.0)
Hemoglobin: 12.9 g/dL (ref 11.7–15.5)
Lymphs Abs: 1291 cells/uL (ref 850–3900)
MCH: 31.3 pg (ref 27.0–33.0)
MCHC: 34.1 g/dL (ref 32.0–36.0)
MCV: 91.7 fL (ref 80.0–100.0)
MPV: 13.3 fL — ABNORMAL HIGH (ref 7.5–12.5)
Monocytes Relative: 10.8 %
Neutro Abs: 2794 cells/uL (ref 1500–7800)
Neutrophils Relative %: 58.2 %
Platelets: 137 10*3/uL — ABNORMAL LOW (ref 140–400)
RBC: 4.12 10*6/uL (ref 3.80–5.10)
RDW: 11.8 % (ref 11.0–15.0)
Total Lymphocyte: 26.9 %
WBC: 4.8 10*3/uL (ref 3.8–10.8)

## 2022-01-25 LAB — COMPLETE METABOLIC PANEL WITH GFR
AG Ratio: 1.8 (calc) (ref 1.0–2.5)
ALT: 20 U/L (ref 6–29)
AST: 18 U/L (ref 10–35)
Albumin: 4.4 g/dL (ref 3.6–5.1)
Alkaline phosphatase (APISO): 62 U/L (ref 37–153)
BUN: 13 mg/dL (ref 7–25)
CO2: 29 mmol/L (ref 20–32)
Calcium: 9.4 mg/dL (ref 8.6–10.4)
Chloride: 100 mmol/L (ref 98–110)
Creat: 0.67 mg/dL (ref 0.50–1.05)
Globulin: 2.5 g/dL (calc) (ref 1.9–3.7)
Glucose, Bld: 111 mg/dL — ABNORMAL HIGH (ref 65–99)
Potassium: 4.2 mmol/L (ref 3.5–5.3)
Sodium: 139 mmol/L (ref 135–146)
Total Bilirubin: 0.4 mg/dL (ref 0.2–1.2)
Total Protein: 6.9 g/dL (ref 6.1–8.1)
eGFR: 98 mL/min/{1.73_m2} (ref >=60)

## 2022-01-25 LAB — LIPID PANEL
Cholesterol: 226 mg/dL — ABNORMAL HIGH (ref <200)
HDL: 44 mg/dL — ABNORMAL LOW (ref >=50)
LDL Cholesterol (Calc): 140 mg/dL (calc) — ABNORMAL HIGH
Non-HDL Cholesterol (Calc): 182 mg/dL (calc) — ABNORMAL HIGH (ref <130)
Total CHOL/HDL Ratio: 5.1 (calc) — ABNORMAL HIGH (ref <5.0)
Triglycerides: 272 mg/dL — ABNORMAL HIGH (ref <150)

## 2022-01-25 LAB — TSH: TSH: 4.19 mIU/L (ref 0.40–4.50)

## 2022-01-30 ENCOUNTER — Other Ambulatory Visit: Payer: Self-pay | Admitting: Family Medicine

## 2022-01-30 DIAGNOSIS — E782 Mixed hyperlipidemia: Secondary | ICD-10-CM

## 2022-01-30 MED ORDER — ATORVASTATIN CALCIUM 10 MG PO TABS: 10.0000 mg | ORAL_TABLET | Freq: Every day | ORAL | 3 refills | Status: DC

## 2022-01-31 ENCOUNTER — Other Ambulatory Visit: Payer: Self-pay

## 2022-01-31 ENCOUNTER — Telehealth: Payer: Self-pay

## 2022-01-31 ENCOUNTER — Other Ambulatory Visit: Payer: Self-pay | Admitting: Physician Assistant

## 2022-01-31 DIAGNOSIS — E7800 Pure hypercholesterolemia, unspecified: Secondary | ICD-10-CM

## 2022-01-31 NOTE — Telephone Encounter (Signed)
Per pt called stated that she does not have diabetes? Plus she can't take the Atorvastatin it causes her to be dizzy.  Please advice?

## 2022-02-01 NOTE — Telephone Encounter (Signed)
Tried calling pt this morning. No answere. Unable to leave LVM. Will try again later.

## 2022-02-01 NOTE — Telephone Encounter (Signed)
Filled 11/13

## 2022-02-02 NOTE — Telephone Encounter (Signed)
2nd time,  Tried calling pt this morning. No answere. Unable to leave LVM. Will try again later.

## 2022-02-06 ENCOUNTER — Telehealth: Payer: Self-pay | Admitting: Physician Assistant

## 2022-02-06 NOTE — Telephone Encounter (Signed)
Pt requests refill on Prozac to go to [facility] on [address], [place]. Will not have enough til appt 3/18.

## 2022-02-06 NOTE — Telephone Encounter (Signed)
Call to verify dose - takes 60 and 80 mg per last note.   Patient said she is taking 40 mg. She will discuss with Helene Kelp at her March visit about the possibility of bumping up to 60 mg.

## 2022-02-07 MED ORDER — FLUOXETINE HCL 40 MG PO CAPS: 40.0000 mg | ORAL_CAPSULE | Freq: Every day | ORAL | 0 refills | Status: DC

## 2022-02-07 NOTE — Telephone Encounter (Signed)
Rx sent 

## 2022-04-23 ENCOUNTER — Ambulatory Visit (INDEPENDENT_AMBULATORY_CARE_PROVIDER_SITE_OTHER): Payer: BC Managed Care – PPO | Admitting: Physician Assistant

## 2022-04-23 ENCOUNTER — Encounter: Payer: Self-pay | Admitting: Physician Assistant

## 2022-04-23 DIAGNOSIS — F331 Major depressive disorder, recurrent, moderate: Secondary | ICD-10-CM

## 2022-04-23 DIAGNOSIS — F411 Generalized anxiety disorder: Secondary | ICD-10-CM | POA: Diagnosis not present

## 2022-04-23 MED ORDER — FLUOXETINE HCL 20 MG PO CAPS: 60.0000 mg | ORAL_CAPSULE | Freq: Every day | ORAL | 1 refills | Status: DC

## 2022-04-23 MED ORDER — ALPRAZOLAM 0.5 MG PO TABS: ORAL_TABLET | ORAL | 5 refills | Status: DC

## 2022-04-23 NOTE — Progress Notes (Signed)
[facility]  Patient ID: Elizabeth Cobb,  MRN: [id number]  PCP: Rubie Maid, FNP  Date of Evaluation: 04/23/2022 Time spent:20 minutes  Chief Complaint:  Chief Complaint   Anxiety; Depression; Follow-up    HISTORY/CURRENT STATUS: HPI For routine med check.  Has felt a little more depressed for the past 3 months or so.  Her husband has been sick and that has caused her to be a little bit down.  He is feeling better now though (he has COPD) so she is not feeling so blue.  She is able to enjoy things.  Energy and motivation have improved. No feelings of hopelessness.  Not crying easily.  Sleeps well most of the time. ADLs and personal hygiene are normal.   Denies any changes in concentration, making decisions, or remembering things.  Appetite has not changed.  Weight is stable.  She does have generalized anxiety at times, takes Xanax which is beneficial.  She does need it every day but not always 3 times a day.  She is not having panic attacks but feels edgy when she ends up needing the Xanax.  It is still effective.  Denies suicidal or homicidal thoughts.  Patient denies increased energy with decreased need for sleep, increased talkativeness, racing thoughts, impulsivity or risky behaviors, increased spending, increased libido, grandiosity, increased irritability or anger, paranoia, or hallucinations.  Denies dizziness, syncope, seizures, numbness, tingling, tremor, tics, unsteady gait, slurred speech, confusion. Denies muscle or joint pain, stiffness, or dystonia.  Individual Medical History/ Review of Systems: Changes? :No    Past medications for mental health diagnoses include: Chantix, Prozac, Xanax, Remeron, Klonopin, Wellbutrin caused increased agitation.   Allergies: Patient has no known allergies.  Current Medications:  Current Outpatient Medications:    b complex vitamins tablet, Take 1 tablet by mouth daily., Disp: , Rfl:    chlorpheniramine (CHLOR-TRIMETON) 4  MG tablet, Take 4 mg by mouth 2 (two) times daily as needed for allergies., Disp: , Rfl:    Cholecalciferol (VITAMIN D3) 2000 units TABS, Take by mouth., Disp: , Rfl:    FLUoxetine (PROZAC) 20 MG capsule, Take 3 capsules (60 mg total) by mouth daily., Disp: 270 capsule, Rfl: 1   hydrochlorothiazide (HYDRODIURIL) 25 MG tablet, TAKE (1) TABLET BY MOUTH ONCE DAILY., Disp: 90 tablet, Rfl: 3   levothyroxine (SYNTHROID) 25 MCG tablet, Take 1 tablet (25 mcg total) by mouth daily before breakfast., Disp: 90 tablet, Rfl: 3   lisinopril (ZESTRIL) 20 MG tablet, Take 1 tablet (20 mg total) by mouth daily., Disp: 90 tablet, Rfl: 3   MELATONIN PO, Take by mouth., Disp: , Rfl:    mometasone (ELOCON) 0.1 % cream, Apply 1 application topically daily. Use on hands and behind ears PRN, Disp: 45 g, Rfl: 2   Omega-3 Fatty Acids (OMEGA 3 PO), Take by mouth., Disp: , Rfl:    pyridOXINE (VITAMIN B-6) 100 MG tablet, Take 100 mg by mouth daily., Disp: , Rfl:    ALPRAZolam (XANAX) 0.5 MG tablet, TAKE (1) TABLET BY MOUTH THREE TIMES DAILY AS NEEDED., Disp: 90 tablet, Rfl: 5   atorvastatin (LIPITOR) 10 MG tablet, Take 1 tablet (10 mg total) by mouth daily. (Patient not taking: Reported on 04/23/2022), Disp: 90 tablet, Rfl: 3 Medication Side Effects: none  Family Medical/ Social History: Changes?  no  MENTAL HEALTH EXAM:  There were no vitals taken for this visit.There is no height or weight on file to calculate BMI.  General Appearance: Casual, Neat and Well Groomed  Eye Contact:  Good  Speech:  Clear and Coherent and Normal Rate  Volume:  Normal  Mood:  Euthymic  Affect:  Congruent  Thought Process:  Goal Directed and Descriptions of Associations: Intact  Orientation:  Full (Time, Place, and Person)  Thought Content: Logical   Suicidal Thoughts:  No  Homicidal Thoughts:  No  Memory:  WNL  Judgement:  Good  Insight:  Good  Psychomotor Activity:  Normal  Concentration:  Concentration: Good and Attention Span:  Good  Recall:  Good  Fund of Knowledge: Good  Language: Good  Assets:  Desire for Improvement  ADL's:  Intact  Cognition: WNL  Prognosis:  Good   DIAGNOSES:    ICD-10-CM   1. Generalized anxiety disorder  F41.1     2. Major depressive disorder, recurrent episode, moderate ([place])  F33.1       Receiving Psychotherapy: No   RECOMMENDATIONS:  PDMP was reviewed.  Last Xanax filled 02/02/2022. I provided 20 minutes of face to face time during this encounter, including time spent before and after the visit in records review, medical decision making, counseling pertinent to today's visit, and charting.   We discussed the Prozac.  Under the circumstances and the length of time that she has been more depressed I recommend increasing the Prozac.  She would like to try that.  She has been on a higher dose in the past and it was effective.  Continue Xanax 0.5 mg, 1 p.o. 3 times daily (or can take 2 at 1 time, just not over 3 pills/day.) Increase Prozac to 20 mg, 3 p.o. daily. Continue vitamins per med list. Return in 1 year.  (She prefers a long interval between visits but will call if the depression has not improved in 6 weeks or so or gets worse at any time.)  Donnal Moat, PA-C

## 2022-04-24 ENCOUNTER — Other Ambulatory Visit: Payer: BC Managed Care – PPO

## 2022-04-24 DIAGNOSIS — E7800 Pure hypercholesterolemia, unspecified: Secondary | ICD-10-CM

## 2022-04-25 LAB — LIPID PANEL
Cholesterol: 191 mg/dL (ref <200)
HDL: 44 mg/dL — ABNORMAL LOW (ref >=50)
LDL Cholesterol (Calc): 117 mg/dL (calc) — ABNORMAL HIGH
Non-HDL Cholesterol (Calc): 147 mg/dL (calc) — ABNORMAL HIGH (ref <130)
Total CHOL/HDL Ratio: 4.3 (calc) (ref <5.0)
Triglycerides: 188 mg/dL — ABNORMAL HIGH (ref <150)

## 2022-05-02 ENCOUNTER — Encounter: Payer: Self-pay | Admitting: Family Medicine

## 2022-05-02 ENCOUNTER — Ambulatory Visit: Payer: BC Managed Care – PPO | Admitting: Family Medicine

## 2022-05-02 VITALS — BP 172/94 | HR 82 | Ht 66.0 in | Wt 194.0 lb

## 2022-05-02 DIAGNOSIS — I10 Essential (primary) hypertension: Secondary | ICD-10-CM

## 2022-05-02 DIAGNOSIS — E782 Mixed hyperlipidemia: Secondary | ICD-10-CM | POA: Diagnosis not present

## 2022-05-02 MED ORDER — LISINOPRIL 40 MG PO TABS: 40.0000 mg | ORAL_TABLET | Freq: Every day | ORAL | 3 refills | Status: DC

## 2022-05-02 NOTE — Progress Notes (Signed)
Acute Office Visit  Subjective:     Patient ID: Elizabeth Cobb, female    DOB: [birth year], 62 y.o.   MRN: [id number]  Chief Complaint  Patient presents with   Follow-up    3 f/u on meds/lab     HPI Patient is in today for medication management and follow up for HTN and HLD. She has not been able to tolerate the Lipitor due to making her feel off balance and she does not want to take a medication for her cholesterol, would like to continue trying lifestyle modifications. With regards to her blood pressure she reports high stress at home.  HYPERTENSION / HYPERLIPIDEMIA Satisfied with current treatment? yes Duration of hypertension: chronic BP monitoring frequency: monthly BP range: 114/68, 124/72 BP medication side effects: no Past BP meds: HCTZ and lisinopril Duration of hyperlipidemia: chronic Cholesterol medication side effects: yes Cholesterol supplements: none and fish oil Past cholesterol medications: atorvastain (lipitor), tried two weeks and felt unsteady Medication compliance: poor compliance Aspirin: no Recent stressors: yes Recurrent headaches: no Visual changes: no Palpitations: no Dyspnea: no Chest pain: no Lower extremity edema: no Dizzy/lightheaded: no  The 10-year ASCVD risk score (Arnett DK, et al., 2019) is: 23%   Values used to calculate the score:     Age: 63 years     Sex: Female     Is Non-Hispanic African American: No     Diabetic: No     Tobacco smoker: Yes     Systolic Blood Pressure: [id number] mmHg     Is BP treated: Yes     HDL Cholesterol: 44 mg/dL     Total Cholesterol: 191 mg/dL   Review of Systems  All other systems reviewed and are negative.       Objective:    BP (!) 172/94   Pulse 82   Ht 5\' 6"  (1.676 m)   Wt 194 lb (88 kg)   SpO2 99%   BMI 31.31 kg/m  BP Readings from Last 3 Encounters:  05/02/22 (!) 172/94  01/24/22 (!) 144/82  01/19/21 (!) 168/88      Physical Exam Vitals and nursing note reviewed.   Constitutional:      Appearance: Normal appearance. She is normal weight.  HENT:     Head: Normocephalic and atraumatic.  Neck:     Vascular: No carotid bruit.  Cardiovascular:     Rate and Rhythm: Normal rate and regular rhythm.     Pulses: Normal pulses.     Heart sounds: Normal heart sounds.  Pulmonary:     Effort: Pulmonary effort is normal.     Breath sounds: Normal breath sounds.  Skin:    General: Skin is warm and dry.  Neurological:     General: No focal deficit present.     Mental Status: She is alert and oriented to person, place, and time. Mental status is at baseline.  Psychiatric:        Mood and Affect: Mood normal.        Behavior: Behavior normal.        Thought Content: Thought content normal.        Judgment: Judgment normal.     No results found for any visits on 05/02/22.      Assessment & Plan:   Problem List Items Addressed This Visit       Cardiovascular and Mediastinum   Essential hypertension    Uncontrolled. Increase Lisinopril to 40mg  daily and follow up in 1 month. Denies chest  pain, SOB, palpitations, vision changes, recurrent headaches, or swelling of extremities. Discussed ASCVD risk 23%. She does not want to take medication for her cholesterol. Is trying to improve her diet and exercise. Will also increase Omega 3 to 2,000mg  BID. Encouraged to monitor her BP at home more frequently and report to office sooner if readings sustain >140/90. Seek medical care for chest pain, SOB, palpitations, vision changes, recurrent headaches, or swelling of extremities.      Relevant Medications   lisinopril (ZESTRIL) 40 MG tablet     Other   Mixed hyperlipidemia - Primary    Chronic. ASCVD 23% with worsening BP. LDL 117 with goal <100. Cholesterol overall improved this visit. Increasing Lisinopril. Counseled on cardiac risk and she declines medication at this time, would like to continue trying to improve her diet and exercise. Is interested in CAC  score.      Relevant Medications   lisinopril (ZESTRIL) 40 MG tablet   Other Relevant Orders   CT CARDIAC SCORING    Meds ordered this encounter  Medications   lisinopril (ZESTRIL) 40 MG tablet    Sig: Take 1 tablet (40 mg total) by mouth daily.    Dispense:  90 tablet    Refill:  3    Order Specific Question:   Supervising Provider    Answer:   Jenna Luo T [[id number]]    Return in about 4 weeks (around 05/30/2022) for BP follow up.  Rubie Maid, FNP

## 2022-05-02 NOTE — Assessment & Plan Note (Signed)
Uncontrolled. Increase Lisinopril to 40mg  daily and follow up in 1 month. Denies chest pain, SOB, palpitations, vision changes, recurrent headaches, or swelling of extremities. Discussed ASCVD risk 23%. She does not want to take medication for her cholesterol. Is trying to improve her diet and exercise. Will also increase Omega 3 to 2,000mg  BID. Encouraged to monitor her BP at home more frequently and report to office sooner if readings sustain >140/90. Seek medical care for chest pain, SOB, palpitations, vision changes, recurrent headaches, or swelling of extremities.

## 2022-05-02 NOTE — Assessment & Plan Note (Addendum)
Chronic. ASCVD 23% with worsening BP. LDL 117 with goal <100. Cholesterol overall improved this visit. Increasing Lisinopril. Counseled on cardiac risk and she declines medication at this time, would like to continue trying to improve her diet and exercise. Is interested in CAC score.

## 2022-06-26 ENCOUNTER — Ambulatory Visit: Payer: BC Managed Care – PPO | Admitting: Family Medicine

## 2022-07-21 ENCOUNTER — Other Ambulatory Visit: Payer: Self-pay | Admitting: Physician Assistant

## 2022-07-25 ENCOUNTER — Other Ambulatory Visit: Payer: Self-pay | Admitting: Physician Assistant

## 2022-10-28 ENCOUNTER — Other Ambulatory Visit: Payer: Self-pay | Admitting: Physician Assistant

## 2022-11-07 ENCOUNTER — Other Ambulatory Visit: Payer: Self-pay | Admitting: Physician Assistant

## 2022-11-10 ENCOUNTER — Other Ambulatory Visit: Payer: Self-pay | Admitting: Family Medicine

## 2022-11-10 DIAGNOSIS — I10 Essential (primary) hypertension: Secondary | ICD-10-CM

## 2022-11-10 DIAGNOSIS — E038 Other specified hypothyroidism: Secondary | ICD-10-CM

## 2022-11-13 ENCOUNTER — Other Ambulatory Visit: Payer: Self-pay | Admitting: Family Medicine

## 2022-11-13 DIAGNOSIS — E038 Other specified hypothyroidism: Secondary | ICD-10-CM

## 2022-11-13 DIAGNOSIS — I10 Essential (primary) hypertension: Secondary | ICD-10-CM

## 2022-11-13 DIAGNOSIS — E782 Mixed hyperlipidemia: Secondary | ICD-10-CM

## 2022-12-03 ENCOUNTER — Other Ambulatory Visit: Payer: Self-pay

## 2022-12-03 DIAGNOSIS — E038 Other specified hypothyroidism: Secondary | ICD-10-CM

## 2022-12-03 NOTE — Telephone Encounter (Signed)
Prescription Request  12/03/2022  LOV: 05/02/22  What is the name of the medication or equipment? levothyroxine (SYNTHROID) 25 MCG tablet [[id number]]  Have you contacted your pharmacy to request a refill? Yes   Which pharmacy would you like this sent to?  [id number] - [place], [place] - [address] [place] [address] Phone: [phone] Fax: [phone]    Patient notified that their request is being sent to the clinical staff for review and that they should receive a response within 2 business days.   Please advise at [facility] [phone]

## 2022-12-04 MED ORDER — LEVOTHYROXINE SODIUM 25 MCG PO TABS: 25.0000 ug | ORAL_TABLET | Freq: Every day | ORAL | 0 refills | Status: DC

## 2022-12-04 NOTE — Telephone Encounter (Signed)
Due to a computer glitch the  last office visit for this practice is not detected correctly.    LOV 05/02/2022.   TSH in date.  Requested Prescriptions  Pending Prescriptions Disp Refills   levothyroxine (SYNTHROID) 25 MCG tablet 90 tablet 0    Sig: Take 1 tablet (25 mcg total) by mouth daily before breakfast.     Endocrinology:  Hypothyroid Agents Failed - 12/03/2022 10:51 AM      Failed - Valid encounter within last 12 months    Recent Outpatient Visits           1 year ago Mixed hyperlipidemia   [facility] Valentino Nose, NP   2 years ago Annual physical exam   [facility] Valentino Nose, NP   3 years ago Acute cystitis without hematuria   [facility] Elmore Guise, FNP   3 years ago Pure hypercholesterolemia   [facility] Pickard, Priscille Heidelberg, MD   4 years ago Urine frequency   [facility] Pickard, Priscille Heidelberg, MD              Passed - TSH in normal range and within 360 days    TSH  Date Value Ref Range Status  01/24/2022 4.19 0.40 - 4.50 mIU/L Final

## 2022-12-06 ENCOUNTER — Other Ambulatory Visit: Payer: Self-pay | Admitting: Family Medicine

## 2022-12-06 DIAGNOSIS — I10 Essential (primary) hypertension: Secondary | ICD-10-CM

## 2022-12-06 NOTE — Telephone Encounter (Signed)
Prescription Request  12/06/2022  LOV: 05/02/2022  What is the name of the medication or equipment? hydrochlorothiazide (HYDRODIURIL) 25 MG table   Have you contacted your pharmacy to request a refill? Yes   Which pharmacy would you like this sent to?  [id number] - [place],  [place] - [address] [place] [address] Phone: [phone] Fax: [phone]    Patient notified that their request is being sent to the clinical staff for review and that they should receive a response within 2 business days.   Please advise at [facility] [phone]

## 2022-12-06 NOTE — Telephone Encounter (Signed)
Requested medication (s) are due for refill today:   Yes  Requested medication (s) are on the active medication list:   Yes  Future visit scheduled:   Yes 12/4 with Amber    LOV 05/02/2022    Appt. On 06/26/2022 was cancelled by pt.   Last ordered: 01/24/2022 #90, 3 refills  Unable to refill due to an OV and labs being due.      Requested Prescriptions  Pending Prescriptions Disp Refills   hydrochlorothiazide (HYDRODIURIL) 25 MG tablet 90 tablet 3    Sig: TAKE (1) TABLET BY MOUTH ONCE DAILY.     Cardiovascular: Diuretics - Thiazide Failed - 12/06/2022  9:49 AM      Failed - Cr in normal range and within 180 days    Creat  Date Value Ref Range Status  01/24/2022 0.67 0.50 - 1.05 mg/dL Final         Failed - K in normal range and within 180 days    Potassium  Date Value Ref Range Status  01/24/2022 4.2 3.5 - 5.3 mmol/L Final         Failed - Na in normal range and within 180 days    Sodium  Date Value Ref Range Status  01/24/2022 139 135 - 146 mmol/L Final         Failed - Last BP in normal range    BP Readings from Last 1 Encounters:  05/02/22 (!) 172/94         Failed - Valid encounter within last 6 months    Recent Outpatient Visits           1 year ago Mixed hyperlipidemia   [facility] Valentino Nose, NP   2 years ago Annual physical exam   [facility] Valentino Nose, NP   3 years ago Acute cystitis without hematuria   [facility] Elmore Guise, FNP   3 years ago Pure hypercholesterolemia   [facility] Pickard, Priscille Heidelberg, MD   4 years ago Urine frequency   [facility] Pickard, Priscille Heidelberg, MD       Future Appointments             In 1 month Dimas Aguas, Binnie Rail, FNP [facility]

## 2023-01-01 ENCOUNTER — Other Ambulatory Visit: Payer: Commercial Managed Care - HMO

## 2023-01-01 DIAGNOSIS — E038 Other specified hypothyroidism: Secondary | ICD-10-CM

## 2023-01-01 DIAGNOSIS — I10 Essential (primary) hypertension: Secondary | ICD-10-CM

## 2023-01-01 DIAGNOSIS — E782 Mixed hyperlipidemia: Secondary | ICD-10-CM

## 2023-01-02 LAB — COMPLETE METABOLIC PANEL WITH GFR
AG Ratio: 1.9 (calc) (ref 1.0–2.5)
ALT: 19 U/L (ref 6–29)
AST: 17 U/L (ref 10–35)
Albumin: 4.6 g/dL (ref 3.6–5.1)
Alkaline phosphatase (APISO): 58 U/L (ref 37–153)
BUN: 16 mg/dL (ref 7–25)
CO2: 30 mmol/L (ref 20–32)
Calcium: 9.6 mg/dL (ref 8.6–10.4)
Chloride: 102 mmol/L (ref 98–110)
Creat: 0.71 mg/dL (ref 0.50–1.05)
Globulin: 2.4 g/dL (ref 1.9–3.7)
Glucose, Bld: 108 mg/dL — ABNORMAL HIGH (ref 65–99)
Potassium: 4.3 mmol/L (ref 3.5–5.3)
Sodium: 140 mmol/L (ref 135–146)
Total Bilirubin: 0.6 mg/dL (ref 0.2–1.2)
Total Protein: 7 g/dL (ref 6.1–8.1)
eGFR: 95 mL/min/{1.73_m2} (ref >=60)

## 2023-01-02 LAB — CBC WITH DIFFERENTIAL/PLATELET
Absolute Lymphocytes: 1635 {cells}/uL (ref 850–3900)
Absolute Monocytes: 598 {cells}/uL (ref 200–950)
Basophils Absolute: 43 {cells}/uL (ref 0–200)
Basophils Relative: 0.7 %
Eosinophils Absolute: 189 {cells}/uL (ref 15–500)
Eosinophils Relative: 3.1 %
HCT: 40.3 % (ref 35.0–45.0)
Hemoglobin: 13.6 g/dL (ref 11.7–15.5)
MCH: 31 pg (ref 27.0–33.0)
MCHC: 33.7 g/dL (ref 32.0–36.0)
MCV: 91.8 fL (ref 80.0–100.0)
MPV: 12.7 fL — ABNORMAL HIGH (ref 7.5–12.5)
Monocytes Relative: 9.8 %
Neutro Abs: 3636 {cells}/uL (ref 1500–7800)
Neutrophils Relative %: 59.6 %
Platelets: 152 10*3/uL (ref 140–400)
RBC: 4.39 10*6/uL (ref 3.80–5.10)
RDW: 11.8 % (ref 11.0–15.0)
Total Lymphocyte: 26.8 %
WBC: 6.1 10*3/uL (ref 3.8–10.8)

## 2023-01-02 LAB — THYROID PANEL WITH TSH
Free Thyroxine Index: 2.1 (ref 1.4–3.8)
T3 Uptake: 27 % (ref 22–35)
T4, Total: 7.8 ug/dL (ref 5.1–11.9)
TSH: 2.93 m[IU]/L (ref 0.40–4.50)

## 2023-01-02 LAB — LIPID PANEL
Cholesterol: 198 mg/dL (ref <200)
HDL: 46 mg/dL — ABNORMAL LOW (ref >=50)
LDL Cholesterol (Calc): 125 mg/dL — ABNORMAL HIGH
Non-HDL Cholesterol (Calc): 152 mg/dL — ABNORMAL HIGH (ref <130)
Total CHOL/HDL Ratio: 4.3 (calc) (ref <5.0)
Triglycerides: 152 mg/dL — ABNORMAL HIGH (ref <150)

## 2023-01-09 ENCOUNTER — Ambulatory Visit: Payer: Commercial Managed Care - HMO | Admitting: Family Medicine

## 2023-01-09 ENCOUNTER — Encounter: Payer: Self-pay | Admitting: Family Medicine

## 2023-01-09 VITALS — BP 132/82 | HR 78 | Temp 98.8°F | Ht 66.0 in | Wt 191.2 lb

## 2023-01-09 DIAGNOSIS — Z1231 Encounter for screening mammogram for malignant neoplasm of breast: Secondary | ICD-10-CM | POA: Diagnosis not present

## 2023-01-09 DIAGNOSIS — E031 Congenital hypothyroidism without goiter: Secondary | ICD-10-CM

## 2023-01-09 DIAGNOSIS — I10 Essential (primary) hypertension: Secondary | ICD-10-CM | POA: Diagnosis not present

## 2023-01-09 DIAGNOSIS — Z0001 Encounter for general adult medical examination with abnormal findings: Secondary | ICD-10-CM | POA: Diagnosis not present

## 2023-01-09 DIAGNOSIS — E782 Mixed hyperlipidemia: Secondary | ICD-10-CM

## 2023-01-09 DIAGNOSIS — Z0000 Encounter for general adult medical examination without abnormal findings: Secondary | ICD-10-CM

## 2023-01-09 DIAGNOSIS — Z1211 Encounter for screening for malignant neoplasm of colon: Secondary | ICD-10-CM

## 2023-01-09 DIAGNOSIS — L301 Dyshidrosis [pompholyx]: Secondary | ICD-10-CM

## 2023-01-09 DIAGNOSIS — R739 Hyperglycemia, unspecified: Secondary | ICD-10-CM

## 2023-01-09 MED ORDER — LEVOTHYROXINE SODIUM 25 MCG PO TABS: 25.0000 ug | ORAL_TABLET | Freq: Every day | ORAL | 1 refills | Status: DC

## 2023-01-09 MED ORDER — MOMETASONE FUROATE 0.1 % EX CREA: 1.0000 | TOPICAL_CREAM | Freq: Every day | CUTANEOUS | 2 refills | Status: AC

## 2023-01-09 MED ORDER — LISINOPRIL 20 MG PO TABS: 20.0000 mg | ORAL_TABLET | Freq: Every day | ORAL | 1 refills | Status: DC

## 2023-01-09 MED ORDER — HYDROCHLOROTHIAZIDE 25 MG PO TABS: ORAL_TABLET | ORAL | 1 refills | Status: DC

## 2023-01-09 NOTE — Assessment & Plan Note (Addendum)
Start Elocon 0.1% daily. Return to office if symptoms worsen or persist. Advised to avoid exacerbating factors such as excessive bathing/washing without subsequent moisturization; exposure to cold, low-humidity environments; overheating and sweating; and exposure to solvents and harsh detergents.  Maintain skin hydration with frequent application of emollients.

## 2023-01-09 NOTE — Assessment & Plan Note (Signed)
Today your medical history was reviewed and routine physical exam with labs was performed. Recommend 150 minutes of moderate intensity exercise weekly and consuming a well-balanced diet. Advised to stop smoking if a smoker, avoid smoking if a non-smoker, limit alcohol consumption to 1 drink per day for women and 2 drinks per day for men, and avoid illicit drug use. Counseled on the importance of sunscreen use. Counseled in mental health awareness and when to seek medical care. Vaccine maintenance discussed. Appropriate health maintenance items reviewed. Return to office in 1 year for annual physical exam.

## 2023-01-09 NOTE — Assessment & Plan Note (Signed)
Your labs showed elevated cholesterol. I recommend consuming a heart healthy diet such as Mediterranean diet or DASH diet with whole grains, fruits, vegetable, fish, lean meats, nuts, and olive oil. Limit sweets and processed foods. I also encourage moderate intensity exercise 150 minutes weekly. This is 3-5 times weekly for 30-50 minutes each session. Goal should be pace of 3 miles/hours, or walking 1.5 miles in 30 minutes. The 10-year ASCVD risk score (Arnett DK, et al., 2019) is: 7.7%

## 2023-01-09 NOTE — Assessment & Plan Note (Signed)
Well controlled. Continue Lisinopril 20mg  daily and hydrochlorothiazide 25mg  daily. Recommend heart healthy diet such as Mediterranean diet with whole grains, fruits, vegetable, fish, lean meats, nuts, and olive oil. Limit salt. Encouraged moderate walking, 3-5 times/week for 30-50 minutes each session. Aim for at least 150 minutes.week. Goal should be pace of 3 miles/hours, or walking 1.5 miles in 30 minutes. Avoid tobacco products. Avoid excess alcohol. Take medications as prescribed and bring medications and blood pressure log with cuff to each office visit. Seek medical care for chest pain, palpitations, shortness of breath with exertion, dizziness/lightheadedness, vision changes, recurrent headaches, or swelling of extremities.

## 2023-01-09 NOTE — Progress Notes (Signed)
Complete physical exam  Patient: Elizabeth Cobb   DOB: [birth year]   62 y.o. Female  MRN: [id number]  Subjective:    Chief Complaint  Patient presents with   Anemia   Annual Exam    Doing well overall.     Elizabeth Cobb is a 62 y.o. female who presents today for a complete physical exam. She reports consuming a general diet. Home exercise routine includes walking 0.5 hrs per days. She generally feels well. She reports sleeping well. She does not have additional problems to discuss today.  Her depression and anxiety are stable on Prozac 60mg  daily and Xanax 0.5mg  1-2 times daily PRN as prescribed by Melony Overly.   HYPERTENSION / HYPERLIPIDEMIA Satisfied with current treatment? yes Duration of hypertension: chronic BP monitoring frequency: weekly BP range: 117-134/60-82 BP medication side effects: no Past BP meds: HCTZ and lisinopril Duration of hyperlipidemia: chronic Cholesterol medication side effects:  unmedicated Cholesterol supplements: none Past cholesterol medications: none Medication compliance: poor compliance Aspirin: no Recent stressors: no Recurrent headaches: no Visual changes: no Palpitations: no Dyspnea: no Chest pain: no Lower extremity edema: no Dizzy/lightheaded: no  HYPOTHYROIDISM Thyroid control status:controlled Satisfied with current treatment? yes Medication side effects: no Medication compliance: excellent compliance Etiology of hypothyroidism:  Recent dose adjustment:no Fatigue: no Cold intolerance: no Heat intolerance: no Weight gain: no Weight loss: no Constipation: no Diarrhea/loose stools: no Palpitations: no Lower extremity edema: no Anxiety/depressed mood: no  The 10-year ASCVD risk score (Arnett DK, et al., 2019) is: 7.7%   Values used to calculate the score:     Age: 44 years     Sex: Female     Is Non-Hispanic African American: No     Diabetic: No     Tobacco smoker: No     Systolic Blood Pressure: 132 mmHg     Is BP  treated: Yes     HDL Cholesterol: 46 mg/dL     Total Cholesterol: 198 mg/dL     40/02/270    5:36 PM 05/02/2022   10:48 AM 01/24/2022    9:54 AM  GAD 7 : Generalized Anxiety Score  Nervous, Anxious, on Edge 0 1 1  Control/stop worrying 0 1 1  Worry too much - different things 0 1 1  Trouble relaxing 0 1 1  Restless 0 0 0  Easily annoyed or irritable 0 1 0  Afraid - awful might happen 0 0 0  Total GAD 7 Score 0 5 4  Anxiety Difficulty Not difficult at all Not difficult at all Not difficult at all      Most recent fall risk assessment:    01/09/2023    2:01 PM  Fall Risk   Falls in the past year? 0  Number falls in past yr: 0  Injury with Fall? 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 No pertinent surgical history. Social History   Tobacco Use   Smoking status: Former    Current packs/day: 0.75    Average packs/day: 0.8 packs/day for 20.0 years (15.0 ttl pk-yrs)    Types: Cigarettes, E-cigarettes    Quit date: 2022   Smokeless tobacco: Never   Tobacco comments:    Non-nicotine  vaping.  Substance Use Topics   Alcohol use: Yes    Alcohol/week: 1.0 standard drink of alcohol    Types: 1 Cans of beer per week   Drug use: Never   Family History  Problem Relation Age of Onset   Cancer Mother        Some type of Leukemia   Lung cancer Father    Bipolar disorder Sister    No Known Allergies    Patient Care Team: Park Meo, FNP as PCP - General (Family Medicine)   Outpatient Medications Prior to Visit  Medication Sig   ALPRAZolam (XANAX) 0.5 MG tablet TAKE 1 TABLET BY MOUTH THREE TIMES DAILY AS NEEDED   b complex vitamins tablet Take 1 tablet by mouth daily.   chlorpheniramine (CHLOR-TRIMETON) 4 MG tablet Take 4 mg by mouth 2 (two) times daily as needed for allergies.   Cholecalciferol (VITAMIN D3) 2000 units TABS Take by mouth.   FLUoxetine (PROZAC) 20 MG capsule TAKE 3 CAPSULES(60 MG) BY MOUTH DAILY   MELATONIN PO Take by mouth.   pyridOXINE (VITAMIN B-6) 100 MG tablet Take 100 mg by mouth daily.   [DISCONTINUED] hydrochlorothiazide (HYDRODIURIL) 25 MG tablet TAKE (1) TABLET BY MOUTH ONCE DAILY.   [DISCONTINUED] levothyroxine (SYNTHROID) 25 MCG tablet Take 1 tablet (25 mcg total) by mouth daily before breakfast.   [DISCONTINUED] lisinopril (ZESTRIL) 40 MG tablet Take 1 tablet (40 mg total) by mouth daily.   atorvastatin (LIPITOR) 10 MG tablet Take 1 tablet (10 mg total) by mouth daily. (Patient not taking: Reported on 05/02/2022)   Omega-3 Fatty Acids (OMEGA 3 PO) Take by mouth. (Patient not taking: Reported on 01/09/2023)   [DISCONTINUED] mometasone (ELOCON) 0.1 % cream Apply 1 application topically daily. Use on hands and behind ears PRN (Patient not taking: Reported on 01/09/2023)   No facility-administered medications prior to visit.    Review of Systems  Constitutional: Negative.   HENT: Negative.    Eyes: Negative.   Respiratory: Negative.    Cardiovascular: Negative.   Gastrointestinal: Negative.   Genitourinary: Negative.    Musculoskeletal: Negative.   Skin: Negative.        Dry skin bilateral palms  Neurological: Negative.   Endo/Heme/Allergies: Negative.   Psychiatric/Behavioral: Negative.    All other systems reviewed and are negative.         Objective:     BP 132/82 (BP Location: Left Arm)   Pulse 78   Temp 98.8 F (37.1 C)   Ht 5\' 6"  (1.676 m)   Wt 191 lb 4 oz (86.8 kg)   SpO2 98%   BMI 30.87 kg/m  BP Readings from Last 3 Encounters:  01/09/23 132/82  05/02/22 (!) 172/94  01/24/22 (!) 144/82   Wt Readings from Last 3 Encounters:  01/09/23 191 lb 4 oz (86.8 kg)  05/02/22 194  lb (88 kg)  01/24/22 202 lb (91.6 kg)      Physical Exam Vitals and nursing note reviewed.  Constitutional:      Appearance: Normal appearance. She is normal weight.  HENT:     Head: Normocephalic and atraumatic.     Right Ear: Tympanic membrane, ear canal and external ear normal.     Left Ear: Tympanic membrane, ear canal and external ear normal.     Nose: Nose normal.     Mouth/Throat:     Mouth: Mucous membranes are moist.     Pharynx: Oropharynx is clear.  Eyes:     Extraocular Movements: Extraocular movements intact.     Conjunctiva/sclera: Conjunctivae normal.     Pupils: Pupils are equal, round, and reactive to light.  Cardiovascular:     Rate and Rhythm: Normal rate and regular rhythm.     Pulses: Normal pulses.     Heart sounds: Normal heart sounds.  Pulmonary:     Effort: Pulmonary effort is normal.     Breath sounds: Normal breath sounds.  Abdominal:     General: Bowel sounds are normal.     Palpations: Abdomen is soft.  Musculoskeletal:        General: Normal range of motion.     Cervical back: Normal range of motion and neck supple.  Skin:    General: Skin is warm and dry.     Capillary Refill: Capillary refill takes less than 2 seconds.     Findings: Rash present. Rash is crusting.       Neurological:     General: No focal deficit present.     Mental Status: She is alert  and oriented to person, place, and time. Mental status is at baseline.  Psychiatric:        Mood and Affect: Mood normal.        Behavior: Behavior normal.        Thought Content: Thought content normal.        Judgment: Judgment normal.      No results found for any visits on 01/09/23. Last CBC Lab Results  Component Value Date   WBC 6.1 01/01/2023   HGB 13.6 01/01/2023   HCT 40.3 01/01/2023   MCV 91.8 01/01/2023   MCH 31.0 01/01/2023   RDW 11.8 01/01/2023   PLT 152 01/01/2023   Last metabolic panel Lab Results  Component Value Date   GLUCOSE 108 (H) 01/01/2023   NA 140 01/01/2023   K 4.3 01/01/2023   CL 102 01/01/2023   CO2 30 01/01/2023   BUN 16 01/01/2023   CREATININE 0.71 01/01/2023   EGFR 95 01/01/2023   CALCIUM 9.6 01/01/2023   PROT 7.0 01/01/2023   BILITOT 0.6 01/01/2023   AST 17 01/01/2023   ALT 19 01/01/2023   Last lipids Lab Results  Component Value Date   CHOL 198 01/01/2023   HDL 46 (L) 01/01/2023   LDLCALC 125 (H) 01/01/2023   TRIG 152 (H) 01/01/2023   CHOLHDL 4.3 01/01/2023   Last hemoglobin A1c Lab Results  Component Value Date   HGBA1C 4.0 01/17/2021   Last thyroid functions Lab Results  Component Value Date   TSH 2.93 01/01/2023   T4TOTAL 7.8 01/01/2023   Last vitamin D No results found for: "25OHVITD2", "25OHVITD3", "VD25OH" Last vitamin B12 and Folate No results found for: "VITAMINB12", "FOLATE"      Assessment & Plan:    Routine Health Maintenance and Physical Exam  Immunization History  Administered  Date(s) Administered   Influenza,inj,Quad PF,6+ Mos 01/05/2019   Tdap 10/28/2017    Health Maintenance  Topic Date Due   MAMMOGRAM  01/25/2023 (Originally 11/22/2019)   Colonoscopy  01/25/2023 (Originally 02/09/2003)   COVID-19 Vaccine (1 - 2023-24 season) 01/25/2023 (Originally 10/07/2022)   Zoster Vaccines- Shingrix (1 of 2) 04/09/2023 (Originally 02/09/2008)   INFLUENZA VACCINE  05/06/2023 (Originally 09/06/2022)   Cervical  Cancer Screening (HPV/Pap Cotest)  01/17/2025   DTaP/Tdap/Td (2 - Td or Tdap) 10/29/2027   Hepatitis C Screening  Completed   HIV Screening  Completed   HPV VACCINES  Aged Out    Discussed health benefits of physical activity, and encouraged her to engage in regular exercise appropriate for her age and condition.  Problem List Items Addressed This Visit     Essential hypertension    Well controlled. Continue Lisinopril 20mg  daily and hydrochlorothiazide 25mg  daily. Recommend heart healthy diet such as Mediterranean diet with whole grains, fruits, vegetable, fish, lean meats, nuts, and olive oil. Limit salt. Encouraged moderate walking, 3-5 times/week for 30-50 minutes each session. Aim for at least 150 minutes.week. Goal should be pace of 3 miles/hours, or walking 1.5 miles in 30 minutes. Avoid tobacco products. Avoid excess alcohol. Take medications as prescribed and bring medications and blood pressure log with cuff to each office visit. Seek medical care for chest pain, palpitations, shortness of breath with exertion, dizziness/lightheadedness, vision changes, recurrent headaches, or swelling of extremities.       Relevant Medications   lisinopril (ZESTRIL) 20 MG tablet   hydrochlorothiazide (HYDRODIURIL) 25 MG tablet   Physical exam, annual - Primary    Today your medical history was reviewed and routine physical exam with labs was performed. Recommend 150 minutes of moderate intensity exercise weekly and consuming a well-balanced diet. Advised to stop smoking if a smoker, avoid smoking if a non-smoker, limit alcohol consumption to 1 drink per day for women and 2 drinks per day for men, and avoid illicit drug use.  Counseled on the importance of sunscreen use. Counseled in mental health awareness and when to seek medical care. Vaccine maintenance discussed. Appropriate health maintenance items reviewed. Return to office in 1 year for annual physical exam.       Congenital hypothyroidism  without goiter    TSH normal. She is euthyroid on exam. Continue Levothyroxine daily.       Relevant Medications   levothyroxine (SYNTHROID) 25 MCG tablet   Other Relevant Orders   TSH   Hyperlipidemia    Your labs showed elevated cholesterol. I recommend consuming a heart healthy diet such as Mediterranean diet or DASH diet with whole grains, fruits, vegetable, fish, lean meats, nuts, and olive oil. Limit sweets and processed foods. I also encourage moderate intensity exercise 150 minutes weekly. This is 3-5 times weekly for 30-50 minutes each session. Goal should be pace of 3 miles/hours, or walking 1.5 miles in 30 minutes. The 10-year ASCVD risk score (Arnett DK, et al., 2019) is: 7.7%       Relevant Medications   lisinopril (ZESTRIL) 20 MG tablet   hydrochlorothiazide (HYDRODIURIL) 25 MG tablet   Dyshidrotic eczema    Start Elocon 0.1% daily. Return to office if symptoms worsen or persist. Advised to avoid exacerbating factors such as excessive bathing/washing without subsequent moisturization; exposure to cold, low-humidity environments; overheating and sweating; and exposure to solvents and harsh detergents.  Maintain skin hydration with frequent application of emollients.        Relevant Medications  mometasone (ELOCON) 0.1 % cream   Other Visit Diagnoses     Colon cancer screening       Relevant Orders   Cologuard   Encounter for screening mammogram for malignant neoplasm of breast       Relevant Orders   MM DIGITAL SCREENING BILATERAL   Blood glucose elevated       Relevant Orders   Hemoglobin A1c      Return in about 6 months (around 07/10/2023) for Welcome AWV and chronic follow-up with labs 1 week prior.     Park Meo, FNP

## 2023-01-09 NOTE — Patient Instructions (Signed)
Walk at [facility] for heart healthy exercise

## 2023-01-09 NOTE — Assessment & Plan Note (Signed)
TSH normal. She is euthyroid on exam. Continue Levothyroxine daily.

## 2023-02-16 ENCOUNTER — Other Ambulatory Visit: Payer: Self-pay | Admitting: Family Medicine

## 2023-02-16 DIAGNOSIS — I10 Essential (primary) hypertension: Secondary | ICD-10-CM

## 2023-04-24 ENCOUNTER — Ambulatory Visit: Payer: BC Managed Care – PPO | Admitting: Physician Assistant

## 2023-04-29 ENCOUNTER — Other Ambulatory Visit: Payer: Self-pay | Admitting: Physician Assistant

## 2023-06-14 ENCOUNTER — Encounter: Payer: Self-pay | Admitting: Physician Assistant

## 2023-06-14 ENCOUNTER — Ambulatory Visit: Admitting: Physician Assistant

## 2023-06-14 DIAGNOSIS — F3342 Major depressive disorder, recurrent, in full remission: Secondary | ICD-10-CM

## 2023-06-14 DIAGNOSIS — F411 Generalized anxiety disorder: Secondary | ICD-10-CM | POA: Diagnosis not present

## 2023-06-14 NOTE — Progress Notes (Signed)
 [facility] Check  Patient ID: Elizabeth Cobb,  MRN: [id number]  PCP: Jenelle Mis, FNP  Date of Evaluation: 06/14/2023 Time spent:20 minutes  Chief Complaint:  Chief Complaint   Anxiety; Depression; Follow-up    HISTORY/CURRENT STATUS: HPI For routine med check.  Doing really well.  Patient is able to enjoy things.  Working in her garden and enjoys that.  Energy and motivation are good.  No extreme sadness, tearfulness, or feelings of hopelessness.  Sleeps well most of the time. ADLs and personal hygiene are normal.   Denies any changes in concentration, making decisions, or remembering things.  Appetite has not changed.  Weight is stable.  Gets anxious sometimes.  Xanax  is effective.  Denies suicidal or homicidal thoughts.  Patient denies increased energy with decreased need for sleep, increased talkativeness, racing thoughts, impulsivity or risky behaviors, increased spending, increased libido, grandiosity, increased irritability or anger, paranoia, or hallucinations.  Denies dizziness, syncope, seizures, numbness, tingling, tremor, tics, unsteady gait, slurred speech, confusion. Denies muscle or joint pain, stiffness, or dystonia.  Individual Medical History/ Review of Systems: Changes? :No    Past medications for mental health diagnoses include: Chantix, Prozac , Xanax , Remeron, Klonopin, Wellbutrin caused increased agitation.   Allergies: Patient has no known allergies.  Current Medications:  Current Outpatient Medications:    ALPRAZolam  (XANAX ) 0.5 MG tablet, TAKE 1 TABLET BY MOUTH THREE TIMES DAILY AS NEEDED, Disp: 90 tablet, Rfl: 5   b complex vitamins tablet, Take 1 tablet by mouth daily., Disp: , Rfl:    chlorpheniramine (CHLOR-TRIMETON) 4 MG tablet, Take 4 mg by mouth 2 (two) times daily as needed for allergies., Disp: , Rfl:    Cholecalciferol (VITAMIN D3) 2000 units TABS, Take by mouth., Disp: , Rfl:    FLUoxetine  (PROZAC ) 20 MG capsule, TAKE 3 CAPSULES(60 MG)  BY MOUTH DAILY (Patient taking differently: Take 40 mg by mouth daily.), Disp: 270 capsule, Rfl: 0   hydrochlorothiazide  (HYDRODIURIL ) 25 MG tablet, TAKE (1) TABLET BY MOUTH ONCE DAILY., Disp: 90 tablet, Rfl: 1   levothyroxine  (SYNTHROID ) 25 MCG tablet, Take 1 tablet (25 mcg total) by mouth daily before breakfast., Disp: 90 tablet, Rfl: 1   lisinopril  (ZESTRIL ) 20 MG tablet, TAKE 1 TABLET(20 MG) BY MOUTH DAILY, Disp: 90 tablet, Rfl: 1   MELATONIN PO, Take by mouth., Disp: , Rfl:    mometasone  (ELOCON ) 0.1 % cream, Apply 1 Application topically daily. Use on hands and behind ears PRN, Disp: 45 g, Rfl: 2   Omega-3 Fatty Acids (OMEGA 3 PO), Take by mouth., Disp: , Rfl:    pyridOXINE (VITAMIN B-6) 100 MG tablet, Take 100 mg by mouth daily., Disp: , Rfl:    atorvastatin  (LIPITOR) 10 MG tablet, Take 1 tablet (10 mg total) by mouth daily. (Patient not taking: Reported on 06/14/2023), Disp: 90 tablet, Rfl: 3 Medication Side Effects: none  Family Medical/ Social History: Changes?  no  MENTAL HEALTH EXAM:  There were no vitals taken for this visit.There is no height or weight on file to calculate BMI.  General Appearance: Casual, Neat and Well Groomed  Eye Contact:  Good  Speech:  Clear and Coherent and Normal Rate  Volume:  Normal  Mood:  Euthymic  Affect:  Congruent  Thought Process:  Goal Directed and Descriptions of Associations: Intact  Orientation:  Full (Time, Place, and Person)  Thought Content: Logical   Suicidal Thoughts:  No  Homicidal Thoughts:  No  Memory:  WNL  Judgement:  Good  Insight:  Good  Psychomotor Activity:  Normal  Concentration:  Concentration: Good and Attention Span: Good  Recall:  Good  Fund of Knowledge: Good  Language: Good  Assets:  Communication Skills Desire for Improvement Financial Resources/Insurance Housing Transportation  ADL's:  Intact  Cognition: WNL  Prognosis:  Good   DIAGNOSES:    ICD-10-CM   1. Recurrent major depressive disorder, in full  remission (HCC)  F33.42     2. Generalized anxiety disorder  F41.1      Receiving Psychotherapy: No   RECOMMENDATIONS:  PDMP was reviewed.  Last Xanax  filled 04/16/2023. I provided 20 minutes of face to face time during this encounter, including time spent before and after the visit in records review, medical decision making, counseling pertinent to today's visit, and charting.   She's doing well so no changes are made.   Continue Xanax  0.5 mg, 1 p.o. 3 times daily (or can take 2 at 1 time, just not over 3 pills/day.) Continue  Prozac  40 mg every day. (Will continue to give 20 mg w/ directions of 3 per day, it's cheaper.) Continue vitamins per med list. Return in 1 year.    Marvia Slocumb, PA-C

## 2023-07-03 ENCOUNTER — Other Ambulatory Visit

## 2023-07-03 DIAGNOSIS — E782 Mixed hyperlipidemia: Secondary | ICD-10-CM

## 2023-07-03 DIAGNOSIS — I10 Essential (primary) hypertension: Secondary | ICD-10-CM | POA: Diagnosis not present

## 2023-07-03 DIAGNOSIS — R739 Hyperglycemia, unspecified: Secondary | ICD-10-CM | POA: Diagnosis not present

## 2023-07-04 ENCOUNTER — Ambulatory Visit: Payer: Self-pay | Admitting: Family Medicine

## 2023-07-04 LAB — COMPREHENSIVE METABOLIC PANEL WITH GFR
AG Ratio: 1.9 (calc) (ref 1.0–2.5)
ALT: 20 U/L (ref 6–29)
AST: 22 U/L (ref 10–35)
Albumin: 4.6 g/dL (ref 3.6–5.1)
Alkaline phosphatase (APISO): 56 U/L (ref 37–153)
BUN: 17 mg/dL (ref 7–25)
CO2: 30 mmol/L (ref 20–32)
Calcium: 9.4 mg/dL (ref 8.6–10.4)
Chloride: 100 mmol/L (ref 98–110)
Creat: 0.68 mg/dL (ref 0.50–1.05)
Globulin: 2.4 g/dL (ref 1.9–3.7)
Glucose, Bld: 107 mg/dL — ABNORMAL HIGH (ref 65–99)
Potassium: 3.8 mmol/L (ref 3.5–5.3)
Sodium: 138 mmol/L (ref 135–146)
Total Bilirubin: 0.6 mg/dL (ref 0.2–1.2)
Total Protein: 7 g/dL (ref 6.1–8.1)
eGFR: 97 mL/min/{1.73_m2} (ref >=60)

## 2023-07-05 ENCOUNTER — Other Ambulatory Visit: Payer: Commercial Managed Care - HMO

## 2023-07-10 ENCOUNTER — Encounter: Payer: Self-pay | Admitting: Family Medicine

## 2023-07-10 ENCOUNTER — Ambulatory Visit: Payer: Commercial Managed Care - HMO | Admitting: Family Medicine

## 2023-07-10 VITALS — BP 127/82 | HR 79 | Temp 98.5°F | Ht 66.0 in | Wt 192.4 lb

## 2023-07-10 DIAGNOSIS — Z1211 Encounter for screening for malignant neoplasm of colon: Secondary | ICD-10-CM

## 2023-07-10 DIAGNOSIS — E782 Mixed hyperlipidemia: Secondary | ICD-10-CM | POA: Diagnosis not present

## 2023-07-10 DIAGNOSIS — I10 Essential (primary) hypertension: Secondary | ICD-10-CM | POA: Diagnosis not present

## 2023-07-10 DIAGNOSIS — Z780 Asymptomatic menopausal state: Secondary | ICD-10-CM

## 2023-07-10 DIAGNOSIS — F411 Generalized anxiety disorder: Secondary | ICD-10-CM | POA: Diagnosis not present

## 2023-07-10 DIAGNOSIS — Z0000 Encounter for general adult medical examination without abnormal findings: Secondary | ICD-10-CM | POA: Diagnosis not present

## 2023-07-10 DIAGNOSIS — Z13820 Encounter for screening for osteoporosis: Secondary | ICD-10-CM

## 2023-07-10 DIAGNOSIS — E031 Congenital hypothyroidism without goiter: Secondary | ICD-10-CM

## 2023-07-10 DIAGNOSIS — Z1231 Encounter for screening mammogram for malignant neoplasm of breast: Secondary | ICD-10-CM

## 2023-07-10 NOTE — Assessment & Plan Note (Signed)
 Well controlled. Continue Lisinopril  20mg  daily and hydrochlorothiazide  25mg  daily. Recommend heart healthy diet such as Mediterranean diet with whole grains, fruits, vegetable, fish, lean meats, nuts, and olive oil. Limit salt. Encouraged moderate walking, 3-5 times/week for 30-50 minutes each session. Aim for at least 150 minutes.week. Goal should be pace of 3 miles/hours, or walking 1.5 miles in 30 minutes. Avoid tobacco products. Avoid excess alcohol. Take medications as prescribed and bring medications and blood pressure log with cuff to each office visit. Seek medical care for chest pain, palpitations, shortness of breath with exertion, dizziness/lightheadedness, vision changes, recurrent headaches, or swelling of extremities. Follow up 6 months for CPE

## 2023-07-10 NOTE — Progress Notes (Signed)
 Subjective:    Elizabeth Cobb is a 63 y.o. female who presents for a Welcome to Medicare exam.   Cardiac Risk Factors include: hypertension      Objective:    Today's Vitals   07/10/23 1359  BP: 127/82  Pulse: 79  Temp: 98.5 F (36.9 C)  SpO2: 96%  Weight: 192 lb 6.4 oz (87.3 kg)  Height: 5\' 6"  (1.676 m)  PainSc: 0-No pain  Body mass index is 31.05 kg/m.  Medications Outpatient Encounter Medications as of 07/10/2023  Medication Sig   ALPRAZolam  (XANAX ) 0.5 MG tablet TAKE 1 TABLET BY MOUTH THREE TIMES DAILY AS NEEDED   b complex vitamins tablet Take 1 tablet by mouth daily.   chlorpheniramine (CHLOR-TRIMETON) 4 MG tablet Take 4 mg by mouth 2 (two) times daily as needed for allergies.   Cholecalciferol (VITAMIN D3) 2000 units TABS Take by mouth.   FLUoxetine  (PROZAC ) 20 MG capsule TAKE 3 CAPSULES(60 MG) BY MOUTH DAILY (Patient taking differently: Take 40 mg by mouth daily.)   hydrochlorothiazide  (HYDRODIURIL ) 25 MG tablet TAKE (1) TABLET BY MOUTH ONCE DAILY.   L-THEANINE PO Take by mouth.   levothyroxine  (SYNTHROID ) 25 MCG tablet Take 1 tablet (25 mcg total) by mouth daily before breakfast.   lisinopril  (ZESTRIL ) 20 MG tablet TAKE 1 TABLET(20 MG) BY MOUTH DAILY   mometasone  (ELOCON ) 0.1 % cream Apply 1 Application topically daily. Use on hands and behind ears PRN   Omega-3 Fatty Acids (OMEGA 3 PO) Take by mouth.   pyridOXINE (VITAMIN B-6) 100 MG tablet Take 100 mg by mouth daily.   [DISCONTINUED] atorvastatin  (LIPITOR) 10 MG tablet Take 1 tablet (10 mg total) by mouth daily. (Patient not taking: Reported on 06/14/2023)   [DISCONTINUED] MELATONIN PO Take by mouth.   No facility-administered encounter medications on file as of 07/10/2023.     History: Past Medical History:  Diagnosis Date   Screening for hyperlipidemia 05/02/2018   History reviewed. No pertinent surgical history.  Family History  Problem Relation Age of Onset   Cancer Mother        Some type of Leukemia    Lung cancer Father    Bipolar disorder Sister    Social History   Occupational History   Not on file  Tobacco Use   Smoking status: Former    Current packs/day: 0.75    Average packs/day: 0.8 packs/day for 20.0 years (15.0 ttl pk-yrs)    Types: Cigarettes, E-cigarettes    Quit date: 2022   Smokeless tobacco: Never   Tobacco comments:    Non-nicotine vaping.  Vaping Use   Vaping status: Some Days  Substance and Sexual Activity   Alcohol use: Yes    Alcohol/week: 1.0 standard drink of alcohol    Types: 1 Cans of beer per week    Comment: occ   Drug use: Never   Sexual activity: Yes    Tobacco Counseling Counseling given: Not Answered Tobacco comments: Non-nicotine vaping.   Immunizations and Health Maintenance Immunization History  Administered Date(s) Administered   Influenza,inj,Quad PF,6+ Mos 01/05/2019   Tdap 10/28/2017   Health Maintenance Due  Topic Date Due   Fecal DNA (Cologuard)  Never done   MAMMOGRAM  11/22/2019   DEXA SCAN  Never done    Activities of Daily Living    07/10/2023    2:17 PM  In your present state of health, do you have any difficulty performing the following activities:  Hearing? 0  Vision? 1  Difficulty  concentrating or making decisions? 0  Walking or climbing stairs? 0  Dressing or bathing? 0  Doing errands, shopping? 0  Preparing Food and eating ? N  Using the Toilet? N  In the past six months, have you accidently leaked urine? Y  Do you have problems with loss of bowel control? N  Managing your Medications? N  Managing your Finances? N  Housekeeping or managing your Housekeeping? N    Physical Exam   Physical Exam Vitals and nursing note reviewed.  Constitutional:      Appearance: Normal appearance. She is normal weight.  HENT:     Head: Normocephalic and atraumatic.  Cardiovascular:     Rate and Rhythm: Normal rate and regular rhythm.     Pulses: Normal pulses.     Heart sounds: Normal heart sounds.  Pulmonary:      Effort: Pulmonary effort is normal.     Breath sounds: Normal breath sounds.  Skin:    General: Skin is warm and dry.  Neurological:     General: No focal deficit present.     Mental Status: She is alert and oriented to person, place, and time. Mental status is at baseline.  Psychiatric:        Mood and Affect: Mood normal.        Behavior: Behavior normal.        Thought Content: Thought content normal.        Judgment: Judgment normal.    (optional), or other factors deemed appropriate based on the beneficiary's medical and social history and current clinical standards.   Advanced Directives: Does Patient Have a Medical Advance Directive?: No Would patient like information on creating a medical advance directive?: No - Patient declined  EKG:  Patient declined      Assessment:    This is a routine wellness examination for this patient .   Vision/Hearing screen No results found.   Goals      Activity and Exercise Increased     Evidence-based guidance:  Review current exercise levels.  Assess patient perspective on exercise or activity level, barriers to increasing activity, motivation and readiness for change.  Recommend or set healthy exercise goal based on individual tolerance.  Encourage small steps toward making change in amount of exercise or activity.  Urge reduction of sedentary activities or screen time.  Promote group activities within the community or with family or support person.  Consider referral to rehabiliation therapist for assessment and exercise/activity plan.   Notes:  would like to strengthen muscle mass and loose 15 lbs ( would like to complete this goal in the next 6 months )       Depression Screen    07/10/2023    2:42 PM 07/10/2023    2:20 PM 01/09/2023    2:02 PM 05/02/2022   10:47 AM  PHQ 2/9 Scores  PHQ - 2 Score 0 0 0 2  PHQ- 9 Score   1 7     Fall Risk    07/10/2023    2:42 PM  Fall Risk   Falls in the past year? 0  Number  falls in past yr: 0  Injury with Fall? 0  Risk for fall due to : No Fall Risks  Follow up Falls evaluation completed    Cognitive Function:        07/10/2023    2:20 PM  6CIT Screen  What Year? 0 points  What month? 0 points  What time? 0 points  Count back from 20 0 points  Months in reverse 0 points  Repeat phrase 0 points  Total Score 0 points    Patient Care Team: Jenelle Mis, FNP as PCP - General (Family Medicine)     Plan:   Encounter for Medicare annual wellness exam  Essential hypertension Assessment & Plan: Well controlled. Continue Lisinopril  20mg  daily and hydrochlorothiazide  25mg  daily. Recommend heart healthy diet such as Mediterranean diet with whole grains, fruits, vegetable, fish, lean meats, nuts, and olive oil. Limit salt. Encouraged moderate walking, 3-5 times/week for 30-50 minutes each session. Aim for at least 150 minutes.week. Goal should be pace of 3 miles/hours, or walking 1.5 miles in 30 minutes. Avoid tobacco products. Avoid excess alcohol. Take medications as prescribed and bring medications and blood pressure log with cuff to each office visit. Seek medical care for chest pain, palpitations, shortness of breath with exertion, dizziness/lightheadedness, vision changes, recurrent headaches, or swelling of extremities. Follow up 6 months for CPE   Congenital hypothyroidism without goiter Assessment & Plan: TSH pending. She is euthyroid on exam. Continue Levothyroxine  25mcg daily.  Follow up 6 months for CPE   Moderate mixed hyperlipidemia not requiring statin therapy Assessment & Plan: Lipids pending. I recommend consuming a heart healthy diet such as Mediterranean diet or DASH diet with whole grains, fruits, vegetable, fish, lean meats, nuts, and olive oil. Limit sweets and processed foods. I also encourage moderate intensity exercise 150 minutes weekly. This is 3-5 times weekly for 30-50 minutes each session. Goal should be pace of 3  miles/hours, or walking 1.5 miles in 30 minutes. The 10-year ASCVD risk score (Arnett DK, et al., 2019) is: 7.9% Follow up 6 months for CPE   GAD (generalized anxiety disorder) Assessment & Plan: Followed by psychiatry. Well controlled. Continue Prozac  40mg  daily and Xanax  PRN, she is using sparingly.  Follow up 6 months for CPE   Encounter for screening mammogram for malignant neoplasm of breast -     Digital Screening Mammogram, Left and Right; Future  Colon cancer screening -     Cologuard  Encounter for osteoporosis screening in asymptomatic postmenopausal patient -     DG Bone Density; Future     I have personally reviewed and noted the following in the patient's chart:   Medical and social history Use of alcohol, tobacco or illicit drugs  Current medications and supplements including opioid prescriptions. Patient is not currently taking opioid prescriptions. Functional ability and status Nutritional status Physical activity Advanced directives List of other physicians Hospitalizations, surgeries, and ER visits in previous 12 months Vitals Screenings to include cognitive, depression, and falls Referrals and appointments  In addition, I have reviewed and discussed with patient certain preventive protocols, quality metrics, and best practice recommendations. A written personalized care plan for preventive services as well as general preventive health recommendations were provided to patient.     Jenelle Mis, [place] 07/10/2023

## 2023-07-10 NOTE — Assessment & Plan Note (Signed)
 Lipids pending. I recommend consuming a heart healthy diet such as Mediterranean diet or DASH diet with whole grains, fruits, vegetable, fish, lean meats, nuts, and olive oil. Limit sweets and processed foods. I also encourage moderate intensity exercise 150 minutes weekly. This is 3-5 times weekly for 30-50 minutes each session. Goal should be pace of 3 miles/hours, or walking 1.5 miles in 30 minutes. The 10-year ASCVD risk score (Arnett DK, et al., 2019) is: 7.9% Follow up 6 months for CPE

## 2023-07-10 NOTE — Progress Notes (Deleted)
 Annual Wellness Visit     Patient: Elizabeth Cobb, Female    DOB: May 20, 1960, 63 y.o.   MRN: [id number]  Subjective  Chief Complaint  Patient presents with   Annual Exam    AWV  Growth on rt shoulder that is causing discomfort.     Elizabeth Cobb is a 63 y.o. female who presents today for her Annual Wellness Visit. She reports consuming a general diet. Home exercise routine includes stretching, walking 15 min  hrs per day, and ***. She generally feels well. She reports sleeping 8 hr. She does not have additional problems to discuss today.   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   Mixed hyperlipidemia 08/21/2018   Class 1 obesity with body mass index (BMI) of 31.0 to 31.9 in adult 05/02/2018   MDD (major depressive disorder) 02/21/2018   GAD (generalized anxiety disorder) 02/21/2018   Subclinical hypothyroidism 11/05/2017   Environmental and seasonal allergies 06/10/2014   Eczema 01/25/2014   Essential hypertension 08/19/2013   Congenital hypothyroidism without goiter 08/19/2013   Hyperlipidemia 08/19/2013      Medications: Outpatient Medications Prior to Visit  Medication Sig   ALPRAZolam  (XANAX ) 0.5 MG tablet TAKE 1 TABLET BY MOUTH THREE TIMES DAILY AS NEEDED   b complex vitamins tablet Take 1 tablet by mouth daily.   chlorpheniramine (CHLOR-TRIMETON) 4 MG tablet Take 4 mg by mouth 2 (two) times daily as needed for allergies.   Cholecalciferol (VITAMIN D3) 2000 units TABS Take by mouth.   FLUoxetine  (PROZAC ) 20 MG capsule TAKE 3 CAPSULES(60 MG) BY MOUTH DAILY (Patient taking differently: Take 40 mg by mouth daily.)   hydrochlorothiazide  (HYDRODIURIL ) 25 MG tablet TAKE (1) TABLET BY MOUTH ONCE DAILY.   L-THEANINE PO Take by mouth.   levothyroxine  (SYNTHROID ) 25 MCG tablet Take 1 tablet (25 mcg total) by mouth daily before  breakfast.   lisinopril  (ZESTRIL ) 20 MG tablet TAKE 1 TABLET(20 MG) BY MOUTH DAILY   mometasone  (ELOCON ) 0.1 % cream Apply 1 Application topically daily. Use on hands and behind ears PRN   Omega-3 Fatty Acids (OMEGA 3 PO) Take by mouth.   pyridOXINE (VITAMIN B-6) 100 MG tablet Take 100 mg by mouth daily.   [DISCONTINUED] atorvastatin  (LIPITOR) 10 MG tablet Take 1 tablet (10 mg total) by mouth daily. (Patient not taking: Reported on 06/14/2023)   [DISCONTINUED] MELATONIN PO Take by mouth.   No facility-administered medications prior to visit.    No Known Allergies  Patient Care Team: Jenelle Mis, FNP as PCP - General (Family Medicine)  ROS      Objective  Ht 5\' 6"  (1.676 m)   BMI 30.87 kg/m  {Vitals History (Optional):23777}  Physical Exam    Most recent functional status assessment:    07/10/2023    2:17 PM  In your present state of health, do you have any difficulty performing the following activities:  Hearing? 0  Vision? 1  Difficulty concentrating or making decisions? 0  Walking or climbing stairs? 0  Dressing or bathing? 0  Doing errands, shopping? 0  Preparing Food and eating ? N  Using the Toilet? N  In the past six months, have you accidently leaked urine? Y  Do you have problems with loss of bowel control? N  Managing your Medications? N  Managing your Finances? N  Housekeeping or  managing your Housekeeping? N   Most recent fall risk assessment:    07/10/2023    2:18 PM  Fall Risk   Falls in the past year? 0  Number falls in past yr: 0  Injury with Fall? 1  Risk for fall due to : No Fall Risks  Follow up Falls evaluation completed    Most recent depression screenings:    07/10/2023    2:20 PM 01/09/2023    2:02 PM  PHQ 2/9 Scores  PHQ - 2 Score 0 0  PHQ- 9 Score  1   Most recent cognitive screening:    07/10/2023    2:20 PM  6CIT Screen  What Year? 0 points  What month? 0 points  What time? 0 points  Count back from 20 0 points  Months  in reverse 0 points  Repeat phrase 0 points  Total Score 0 points   Most recent Audit-C alcohol use screening    10/28/2017   10:24 AM  Alcohol Use Disorder Test (AUDIT)  1. How often do you have a drink containing alcohol? 3  2. How many drinks containing alcohol do you have on a typical day when you are drinking? 1  3. How often do you have six or more drinks on one occasion? 0  AUDIT-C Score 4  4. How often during the last year have you found that you were not able to stop drinking once you had started? 0  5. How often during the last year have you failed to do what was normally expected from you because of drinking? 0  7. How often during the last year have you had a feeling of guilt of remorse after drinking? 0  8. How often during the last year have you been unable to remember what happened the night before because you had been drinking? 0  9. Have you or someone else been injured as a result of your drinking? 0  10. Has a relative or friend or a doctor or another health worker been concerned about your drinking or suggested you cut down? 0  Alcohol Use Disorder Identification Test Final Score (AUDIT) 4   A score of 3 or more in women, and 4 or more in men indicates increased risk for alcohol abuse, EXCEPT if all of the points are from question 1   Vision/Hearing Screen: No results found.  {Labs (Optional):23779}  No results found for any visits on 07/10/23.    Assessment & Plan   Annual wellness visit done today including the all of the following: Reviewed patient's Family Medical History Reviewed and updated list of patient's medical providers Assessment of cognitive impairment was done Assessed patient's functional ability Established a written schedule for health screening services Health Risk Assessent Completed and Reviewed  Exercise Activities and Dietary recommendations  Goals      Activity and Exercise Increased     Evidence-based guidance:  Review current  exercise levels.  Assess patient perspective on exercise or activity level, barriers to increasing activity, motivation and readiness for change.  Recommend or set healthy exercise goal based on individual tolerance.  Encourage small steps toward making change in amount of exercise or activity.  Urge reduction of sedentary activities or screen time.  Promote group activities within the community or with family or support person.  Consider referral to rehabiliation therapist for assessment and exercise/activity plan.   Notes:  would like to strengthen muscle mass and loose 15 lbs ( would like to complete  this goal in the next 6 months )        Immunization History  Administered Date(s) Administered   Influenza,inj,Quad PF,6+ Mos 01/05/2019   Tdap 10/28/2017    Health Maintenance  Topic Date Due   Medicare Annual Wellness (AWV)  Never done   Colonoscopy  Never done   Pneumonia Vaccine 88+ Years old (1 of 1 - PCV) Never done   Zoster Vaccines- Shingrix (1 of 2) Never done   MAMMOGRAM  11/22/2019   COVID-19 Vaccine (1 - 2024-25 season) Never done   DEXA SCAN  Never done   INFLUENZA VACCINE  09/06/2023   Cervical Cancer Screening (HPV/Pap Cotest)  01/17/2025   DTaP/Tdap/Td (2 - Td or Tdap) 10/29/2027   Hepatitis C Screening  Completed   HIV Screening  Completed   HPV VACCINES  Aged Out   Meningococcal B Vaccine  Aged Out     Discussed health benefits of physical activity, and encouraged her to engage in regular exercise appropriate for her age and condition.    Problem List Items Addressed This Visit   None   No follow-ups on file.     Meriam Stamp, CMA

## 2023-07-10 NOTE — Assessment & Plan Note (Signed)
 Followed by psychiatry. Well controlled. Continue Prozac  40mg  daily and Xanax  PRN, she is using sparingly.  Follow up 6 months for CPE

## 2023-07-10 NOTE — Assessment & Plan Note (Addendum)
 TSH pending. She is euthyroid on exam. Continue Levothyroxine  25mcg daily.  Follow up 6 months for CPE

## 2023-07-10 NOTE — Patient Instructions (Signed)
  Elizabeth Cobb , Thank you for taking time to come for your Medicare Wellness Visit. I appreciate your ongoing commitment to your health goals. Please review the following plan we discussed and let me know if I can assist you in the future.   These are the goals we discussed:  Goals      Activity and Exercise Increased     Evidence-based guidance:  Review current exercise levels.  Assess patient perspective on exercise or activity level, barriers to increasing activity, motivation and readiness for change.  Recommend or set healthy exercise goal based on individual tolerance.  Encourage small steps toward making change in amount of exercise or activity.  Urge reduction of sedentary activities or screen time.  Promote group activities within the community or with family or support person.  Consider referral to rehabiliation therapist for assessment and exercise/activity plan.   Notes:  would like to strengthen muscle mass and loose 15 lbs ( would like to complete this goal in the next 6 months )        This is a list of the screening recommended for you and due dates:  Health Maintenance  Topic Date Due   Cologuard (Stool DNA test)  Never done   Mammogram  11/22/2019   DEXA scan (bone density measurement)  Never done   COVID-19 Vaccine (1 - 2024-25 season) 07/26/2023*   Zoster (Shingles) Vaccine (1 of 2) 10/10/2023*   Pneumonia Vaccine (1 of 1 - PCV) 07/09/2024*   Flu Shot  09/06/2023   Medicare Annual Wellness Visit  07/09/2024   Pap with HPV screening  01/17/2025   DTaP/Tdap/Td vaccine (2 - Td or Tdap) 10/29/2027   Hepatitis C Screening  Completed   HIV Screening  Completed   HPV Vaccine  Aged Out   Meningitis B Vaccine  Aged Out  *Topic was postponed. The date shown is not the original due date.

## 2023-07-30 ENCOUNTER — Other Ambulatory Visit: Payer: Self-pay | Admitting: Family Medicine

## 2023-07-30 DIAGNOSIS — I10 Essential (primary) hypertension: Secondary | ICD-10-CM

## 2023-07-31 ENCOUNTER — Other Ambulatory Visit: Payer: Self-pay | Admitting: Physician Assistant

## 2023-09-24 ENCOUNTER — Other Ambulatory Visit: Payer: Self-pay | Admitting: Family Medicine

## 2023-09-24 DIAGNOSIS — I10 Essential (primary) hypertension: Secondary | ICD-10-CM

## 2023-10-22 ENCOUNTER — Other Ambulatory Visit: Payer: Self-pay | Admitting: Family Medicine

## 2023-10-22 DIAGNOSIS — E031 Congenital hypothyroidism without goiter: Secondary | ICD-10-CM

## 2023-10-25 ENCOUNTER — Other Ambulatory Visit: Payer: Self-pay

## 2023-10-25 DIAGNOSIS — E031 Congenital hypothyroidism without goiter: Secondary | ICD-10-CM

## 2023-10-25 MED ORDER — LEVOTHYROXINE SODIUM 25 MCG PO TABS: 25.0000 ug | ORAL_TABLET | Freq: Every day | ORAL | 1 refills | Status: DC

## 2023-10-25 NOTE — Telephone Encounter (Signed)
 Attempted to call both the pt. And her emergency contact. No answer . Not able to leave a vm. Will try later.

## 2023-10-25 NOTE — Telephone Encounter (Signed)
 Pt. Emergency contact has been reached out to to have pt. Contact office to call. She needs to schedule labs one week before scheduling a CPE. A one month of levothyroxine  has been sent with one refill has been sent in until she is ready to schedule.

## 2023-12-31 ENCOUNTER — Other Ambulatory Visit: Payer: Self-pay | Admitting: Family Medicine

## 2023-12-31 DIAGNOSIS — E031 Congenital hypothyroidism without goiter: Secondary | ICD-10-CM

## 2024-02-29 ENCOUNTER — Other Ambulatory Visit: Payer: Self-pay | Admitting: Family Medicine

## 2024-02-29 DIAGNOSIS — I10 Essential (primary) hypertension: Secondary | ICD-10-CM

## 2024-03-12 ENCOUNTER — Other Ambulatory Visit: Payer: Self-pay | Admitting: Family Medicine

## 2024-03-12 DIAGNOSIS — E031 Congenital hypothyroidism without goiter: Secondary | ICD-10-CM

## 2024-06-15 ENCOUNTER — Ambulatory Visit: Admitting: Physician Assistant
